# Patient Record
Sex: Female | Born: 1955
Health system: Southern US, Community
[De-identification: ages and names within clinical notes are randomized; demographics above are authoritative.]

## PROBLEM LIST (undated history)

## (undated) DIAGNOSIS — K52831 Collagenous colitis: Secondary | ICD-10-CM

## (undated) DIAGNOSIS — F1021 Alcohol dependence, in remission: Secondary | ICD-10-CM

## (undated) DIAGNOSIS — J42 Unspecified chronic bronchitis: Secondary | ICD-10-CM

## (undated) HISTORY — PX: APPENDECTOMY: SHX54

## (undated) HISTORY — PX: AUGMENTATION MAMMAPLASTY: SUR837

## (undated) HISTORY — PX: BREAST ENHANCEMENT SURGERY: SHX7

---

## 2014-05-04 ENCOUNTER — Encounter: Payer: Self-pay | Admitting: *Deleted

## 2014-05-04 ENCOUNTER — Emergency Department
Admission: EM | Admit: 2014-05-04 | Discharge: 2014-05-04 | Disposition: A | Discharge status: Home / Self Care | Payer: 59 | Source: Home / Self Care | Attending: Family Medicine | Admitting: Family Medicine

## 2014-05-04 DIAGNOSIS — R300 Dysuria: Secondary | ICD-10-CM

## 2014-05-04 DIAGNOSIS — J441 Chronic obstructive pulmonary disease with (acute) exacerbation: Secondary | ICD-10-CM

## 2014-05-04 HISTORY — DX: Collagenous colitis: K52.831

## 2014-05-04 HISTORY — DX: Alcohol dependence, in remission: F10.21

## 2014-05-04 MED ORDER — AZITHROMYCIN 250 MG PO TABS: 250.0000 mg | ORAL_TABLET | Freq: Every day | ORAL | Status: DC

## 2014-05-04 MED ORDER — PREDNISONE 50 MG PO TABS: 50.0000 mg | ORAL_TABLET | Freq: Every day | ORAL | Status: DC

## 2014-05-04 MED ORDER — GUAIFENESIN-CODEINE 100-10 MG/5ML PO SOLN: 5.0000 mL | Freq: Every evening | ORAL | Status: DC | PRN

## 2014-05-04 MED ORDER — IPRATROPIUM-ALBUTEROL 0.5-2.5 (3) MG/3ML IN SOLN
3.0000 mL | Freq: Once | RESPIRATORY_TRACT | Status: AC
  Administered 2014-05-04: 3 mL via RESPIRATORY_TRACT

## 2014-05-04 MED ORDER — ALBUTEROL SULFATE HFA 108 (90 BASE) MCG/ACT IN AERS: 1.0000 | INHALATION_SPRAY | Freq: Four times a day (QID) | RESPIRATORY_TRACT | Status: DC | PRN

## 2014-05-04 NOTE — ED Provider Notes (Signed)
Sara MallardCindy Murray is a 59 y.o. female who presents to Urgent Care today for cough congestion shortness of breath and wheezing. Symptoms present for 5 days. Patient notes subjective fevers and chills fatigue and nausea as well. No chest pain. Patient has tried multiple over-the-counter medications which have not helped much. She is a current smoker with a history of COPD. She did not receive a flu vaccine this season.   Past Medical History  Diagnosis Date  . Recovering alcoholic   . Collagenous colitis    Past Surgical History  Procedure Laterality Date  . Cesarean section    . Appendectomy     History  Substance Use Topics  . Smoking status: Current Every Day Smoker -- 1.00 packs/day for 40 years    Types: Cigarettes  . Smokeless tobacco: Never Used  . Alcohol Use: No     Comment: Recovering alcoholic   ROS as above Medications: No current facility-administered medications for this encounter.   Current Outpatient Prescriptions  Medication Sig Dispense Refill  . albuterol (PROVENTIL HFA;VENTOLIN HFA) 108 (90 BASE) MCG/ACT inhaler Inhale 1-2 puffs into the lungs every 6 (six) hours as needed for wheezing or shortness of breath. 1 Inhaler 0  . azithromycin (ZITHROMAX) 250 MG tablet Take 1 tablet (250 mg total) by mouth daily. Take first 2 tablets together, then 1 every day until finished. 6 tablet 0  . guaiFENesin-codeine 100-10 MG/5ML syrup Take 5 mLs by mouth at bedtime as needed for cough. 120 mL 0  . predniSONE (DELTASONE) 50 MG tablet Take 1 tablet (50 mg total) by mouth daily. 5 tablet 0   Allergies  Allergen Reactions  . Keflex [Cephalexin]      Exam:  BP 131/61 mmHg  Pulse 94  Temp(Src) 97.6 F (36.4 C) (Oral)  Resp 28  Ht 5\' 5"  (1.651 m)  Wt 130 lb (58.968 kg)  BMI 21.63 kg/m2  SpO2 98% Gen: Well NAD HEENT: EOMI,  MMM posterior pharynx with cobblestoning. Normal tympanic membranes bilaterally. Clear nasal discharge present. Lungs: Mild tachypnea with increased work  of breathing. Poor air movement bilaterally with some expiratory wheezing and expiratory delay. Heart: RRR no MRG Abd: NABS, Soft. Nondistended, Nontender Exts: Brisk capillary refill, warm and well perfused.   Patient was given a 2.5/0.5 mg DuoNeb nebulizer treatment, and felt much better. Her tachypnea improved as did her work of breathing and lung exam.  No results found for this or any previous visit (from the past 24 hour(s)). No results found.  Assessment and Plan: 59 y.o. female with COPD exacerbation. Treat with prednisone and albuterol azithromycin and codeine cough suppressant. Note provided. Encouraged patient to quit smoking.  Discussed warning signs or symptoms. Please see discharge instructions. Patient expresses understanding.     Rodolph BongEvan S Yoseline Andersson, MD 05/04/14 [phone]

## 2014-05-04 NOTE — Discharge Instructions (Signed)
Thank you for coming in today. Call or go to the emergency room if you get worse, have trouble breathing, have chest pains, or palpitations.  Do not drive after taking codeine.  Please quit smoking.   Chronic Obstructive Pulmonary Disease Exacerbation Chronic obstructive pulmonary disease (COPD) is a common lung condition in which airflow from the lungs is limited. COPD is a general term that can be used to describe many different lung problems that limit airflow, including chronic bronchitis and emphysema. COPD exacerbations are episodes when breathing symptoms become much worse and require extra treatment. Without treatment, COPD exacerbations can be life threatening, and frequent COPD exacerbations can cause further damage to your lungs. CAUSES   Respiratory infections.   Exposure to smoke.   Exposure to air pollution, chemical fumes, or dust. Sometimes there is no apparent cause or trigger. RISK FACTORS  Smoking cigarettes.  Older age.  Frequent prior COPD exacerbations. SIGNS AND SYMPTOMS   Increased coughing.   Increased thick spit (sputum) production.   Increased wheezing.   Increased shortness of breath.   Rapid breathing.   Chest tightness. DIAGNOSIS  Your medical history, a physical exam, and tests will help your health care provider make a diagnosis. Tests may include:  A chest X-ray.  Basic lab tests.  Sputum testing.  An arterial blood gas test. TREATMENT  Depending on the severity of your COPD exacerbation, you may need to be admitted to a hospital for treatment. Some of the treatments commonly used to treat COPD exacerbations are:   Antibiotic medicines.   Bronchodilators. These are drugs that expand the air passages. They may be given with an inhaler or nebulizer. Spacer devices may be needed to help improve drug delivery.  Corticosteroid medicines.  Supplemental oxygen therapy.  HOME CARE INSTRUCTIONS   Do not smoke. Quitting smoking  is very important to prevent COPD from getting worse and exacerbations from happening as often.  Avoid exposure to all substances that irritate the airway, especially to tobacco smoke.   If you were prescribed an antibiotic medicine, finish it all even if you start to feel better.  Take all medicines as directed by your health care provider.It is important to use correct technique with inhaled medicines.  Drink enough fluids to keep your urine clear or pale yellow (unless you have a medical condition that requires fluid restriction).  Use a cool mist vaporizer. This makes it easier to clear your chest when you cough.   If you have a home nebulizer and oxygen, continue to use them as directed.   Maintain all necessary vaccinations to prevent infections.   Exercise regularly.   Eat a healthy diet.   Keep all follow-up appointments as directed by your health care provider. SEEK IMMEDIATE MEDICAL CARE IF:  You have worsening shortness of breath.   You have trouble talking.   You have severe chest pain.  You have blood in your sputum.  You have a fever.  You have weakness, vomit repeatedly, or faint.   You feel confused.   You continue to get worse. MAKE SURE YOU:   Understand these instructions.  Will watch your condition.  Will get help right away if you are not doing well or get worse. Document Released: 01/07/2007 Document Revised: 07/27/2013 Document Reviewed: 11/14/2012 [facility] Patient Information 2015 [place], [place]. This information is not intended to replace advice given to you by your health care provider. Make sure you discuss any questions you have with your health care provider.

## 2014-05-04 NOTE — ED Notes (Signed)
Kimberlee c/o cough, congestion, HA and SOB x 5 days. 40 year smoker. No flu vac this season.

## 2014-06-05 ENCOUNTER — Emergency Department
Admission: EM | Admit: 2014-06-05 | Discharge: 2014-06-05 | Disposition: A | Discharge status: Home / Self Care | Payer: 59 | Source: Home / Self Care | Attending: Family Medicine | Admitting: Family Medicine

## 2014-06-05 DIAGNOSIS — F418 Other specified anxiety disorders: Secondary | ICD-10-CM

## 2014-06-05 MED ORDER — FLUOXETINE HCL (PMDD) 10 MG PO TABS: 1.0000 | ORAL_TABLET | Freq: Every day | ORAL | Status: DC

## 2014-06-05 NOTE — ED Notes (Signed)
Patient here today with anxiety, patients states that she has a history of anxiety. Episode started this morning at work, she went home and tried to go take a nap to see if she would feel better and did not. States that her job has been really stressful.

## 2014-06-05 NOTE — Discharge Instructions (Signed)
After one week increase fluoxetine (Prozac) 10mg  to two tabs, one time daily    Depression Depression refers to feeling sad, low, down in the dumps, blue, gloomy, or empty. In general, there are two kinds of depression: 1. Normal sadness or normal grief. This kind of depression is one that we all feel from time to time after upsetting life experiences, such as the loss of a job or the ending of a relationship. This kind of depression is considered normal, is short lived, and resolves within a few days to 2 weeks. Depression experienced after the loss of a loved one (bereavement) often lasts longer than 2 weeks but normally gets better with time. 2. Clinical depression. This kind of depression lasts longer than normal sadness or normal grief or interferes with your ability to function at home, at work, and in school. It also interferes with your personal relationships. It affects almost every aspect of your life. Clinical depression is an illness. Symptoms of depression can also be caused by conditions other than those mentioned above, such as:  Physical illness. Some physical illnesses, including underactive thyroid gland (hypothyroidism), severe anemia, specific types of cancer, diabetes, uncontrolled seizures, heart and lung problems, strokes, and chronic pain are commonly associated with symptoms of depression.  Side effects of some prescription medicine. In some people, certain types of medicine can cause symptoms of depression.  Substance abuse. Abuse of alcohol and illicit drugs can cause symptoms of depression. SYMPTOMS Symptoms of normal sadness and normal grief include the following:  Feeling sad or crying for short periods of time.  Not caring about anything (apathy).  Difficulty sleeping or sleeping too much.  No longer able to enjoy the things you used to enjoy.  Desire to be by oneself all the time (social isolation).  Lack of energy or motivation.  Difficulty concentrating  or remembering.  Change in appetite or weight.  Restlessness or agitation. Symptoms of clinical depression include the same symptoms of normal sadness or normal grief and also the following symptoms:  Feeling sad or crying all the time.  Feelings of guilt or worthlessness.  Feelings of hopelessness or helplessness.  Thoughts of suicide or the desire to harm yourself (suicidal ideation).  Loss of touch with reality (psychotic symptoms). Seeing or hearing things that are not real (hallucinations) or having false beliefs about your life or the people around you (delusions and paranoia). DIAGNOSIS  The diagnosis of clinical depression is usually based on how bad the symptoms are and how long they have lasted. Your health care provider will also ask you questions about your medical history and substance use to find out if physical illness, use of prescription medicine, or substance abuse is causing your depression. Your health care provider may also order blood tests. TREATMENT  Often, normal sadness and normal grief do not require treatment. However, sometimes antidepressant medicine is given for bereavement to ease the depressive symptoms until they resolve. The treatment for clinical depression depends on how bad the symptoms are but often includes antidepressant medicine, counseling with a mental health professional, or both. Your health care provider will help to determine what treatment is best for you. Depression caused by physical illness usually goes away with appropriate medical treatment of the illness. If prescription medicine is causing depression, talk with your health care provider about stopping the medicine, decreasing the dose, or changing to another medicine. Depression caused by the abuse of alcohol or illicit drugs goes away when you stop using these substances.  Some adults need professional help in order to stop drinking or using drugs. SEEK IMMEDIATE MEDICAL CARE IF:  You  have thoughts about hurting yourself or others.  You lose touch with reality (have psychotic symptoms).  You are taking medicine for depression and have a serious side effect. FOR MORE INFORMATION  National Alliance on Mental Illness: www.nami.[employer]org  National Institute of Mental Health: http://www.[place]nard.net/www.nimh.nih.gov Document Released: 03/09/2000 Document Revised: 07/27/2013 Document Reviewed: 06/11/2011 [facility]ideExitCare Patient Information 2015 [place] CityExitCare, [place]LL[place]. This information is not intended to replace advice given to you by your health care provider. Make sure you discuss any questions you have with your health care provider.

## 2014-06-05 NOTE — ED Provider Notes (Signed)
CSN: [id number]     Arrival date & time 06/05/14  1727 History   None    Chief Complaint  Patient presents with  . Anxiety      HPI Comments: Patient complains of feeling excessively anxious at work today, where she felt hot and unusually fatigued.  She went home and took a nap without improvement. She reports that she has had increased stress at work for about a year. She complains of increased fatigue, difficulty concentrating, and early morning awakening.  No suicidal thoughts.  She smokes, and admits that she has increased her smoking recently because of stress.  She admits that she is a recovering alcoholic. She has a past history of anxiety and depression, and has tried a variety of SSRI's in the past which were generally helpful.  She states that at present she is limited in her ability to purchase medications.  Patient is a 59 y.o. female presenting with anxiety. The history is provided by the patient.  Anxiety This is a recurrent problem. The current episode started 6 to 12 hours ago. The problem has not changed since onset.Pertinent negatives include no chest pain, no headaches and no shortness of breath. Exacerbated by: stress. Nothing relieves the symptoms. She has tried nothing for the symptoms.    Past Medical History  Diagnosis Date  . Recovering alcoholic   . Collagenous colitis    Past Surgical History  Procedure Laterality Date  . Cesarean section    . Appendectomy     Family History  Problem Relation Age of Onset  . Stroke Father    History  Substance Use Topics  . Smoking status: Current Every Day Smoker -- 1.00 packs/day for 40 years    Types: Cigarettes  . Smokeless tobacco: Never Used  . Alcohol Use: No     Comment: Recovering alcoholic   OB History    No data available     Review of Systems  Constitutional: Positive for fatigue. Negative for activity change, appetite change and unexpected weight change.  Respiratory: Negative for shortness of  breath.   Cardiovascular: Negative for chest pain.  Neurological: Negative for headaches.  Psychiatric/Behavioral: Positive for sleep disturbance and decreased concentration. Negative for suicidal ideas, hallucinations and self-injury. The patient is nervous/anxious.   All other systems reviewed and are negative.   Allergies  Keflex  Home Medications   Prior to Admission medications   Medication Sig Start Date End Date Taking? Authorizing Provider  Fluoxetine HCl, PMDD, 10 MG TABS Take 1 tablet (10 mg total) by mouth daily. 06/05/14   Lattie HawStephen A Beese, MD   BP 158/89 mmHg  Pulse 100  Temp(Src) 98.2 F (36.8 C) (Oral)  Ht 5\' 5"  (1.651 m)  Wt 136 lb (61.689 kg)  BMI 22.63 kg/m2  SpO2 98% Physical Exam Nursing notes and Vital Signs reviewed. Appearance:  Patient appears stated age, and in no acute distress Psychiatric:  Patient is alert and oriented with good eye contact.  Affect is flat and she has depressed mood. Thoughts are organized.  No psychomotor retardation.  Memory intact.  Not suicidal  Eyes:  Pupils are equal, round, and reactive to light and accomodation.  Extraocular movement is intact.  Conjunctivae are not inflamed  Nose:  Normal Pharynx:  Normal Neck:  Supple.  No adenopathy or thyromegaly. Lungs:  Clear to auscultation.  Breath sounds are equal.  Heart:  Regular rate and rhythm without murmurs, rubs, or gallops.  Abdomen:  Nontender without masses or hepatosplenomegaly.  Bowel sounds are present.  No CVA or flank tenderness.  Extremities:  No edema.   Skin:  No rash present.   ED Course  Procedures  none  MDM   1. Depression with anxiety    Begin fluoxetine  once daily. After one week increase fluoxetine (Prozac)  to two tabs, one time daily  Followup with Family Doctor in two weeks.    Lattie Haw, MD 06/08/14 [phone]

## 2015-11-22 ENCOUNTER — Emergency Department
Admission: EM | Admit: 2015-11-22 | Discharge: 2015-11-22 | Disposition: A | Discharge status: Home / Self Care | Payer: 59 | Source: Home / Self Care | Attending: Family Medicine | Admitting: Family Medicine

## 2015-11-22 ENCOUNTER — Encounter: Payer: Self-pay | Admitting: *Deleted

## 2015-11-22 DIAGNOSIS — L0291 Cutaneous abscess, unspecified: Secondary | ICD-10-CM

## 2015-11-22 DIAGNOSIS — L03312 Cellulitis of back [any part except buttock]: Secondary | ICD-10-CM | POA: Diagnosis not present

## 2015-11-22 HISTORY — DX: Unspecified chronic bronchitis: J42

## 2015-11-22 MED ORDER — HYDROCODONE-ACETAMINOPHEN 5-325 MG PO TABS: 1.0000 | ORAL_TABLET | Freq: Four times a day (QID) | ORAL | 0 refills | Status: DC | PRN

## 2015-11-22 MED ORDER — CLINDAMYCIN HCL 300 MG PO CAPS: 300.0000 mg | ORAL_CAPSULE | Freq: Three times a day (TID) | ORAL | 0 refills | Status: DC

## 2015-11-22 NOTE — Discharge Instructions (Signed)
Leave bandage in place until follow-up visit.  Keep bandage clean and dry.

## 2015-11-22 NOTE — ED Provider Notes (Signed)
[facility]    CSN: [id number] Arrival date & time: 11/22/15  1354  First Provider Contact:  First MD Initiated Contact with Patient 11/22/15 1436        History   Chief Complaint Chief Complaint  Patient presents with  . Abscess    HPI Sara Murray is a 60 y.o. female.   Patient complains of four day history of painful draining lesion on her lower back.  She recalls no injury or insect bite.  She has pain when sitting and supine in bed.  No fevers, chills, and sweats    The history is provided by the patient.    Past Medical History:  Diagnosis Date  . Chronic bronchitis (HCC)   . Collagenous colitis   . Recovering alcoholic (HCC)     There are no active problems to display for this patient.   Past Surgical History:  Procedure Laterality Date  . APPENDECTOMY    . CESAREAN SECTION      OB History    No data available       Home Medications    Prior to Admission medications   Medication Sig Start Date End Date Taking? Authorizing Provider  acetaminophen (TYLENOL) 325 MG tablet Take 650 mg by mouth every 6 (six) hours as needed.   Yes Historical Provider, MD  aspirin EC 81 MG tablet Take 81 mg by mouth daily.   Yes Historical Provider, MD  MULTIPLE VITAMIN PO Take by mouth.   Yes Historical Provider, MD  clindamycin (CLEOCIN) 300 MG capsule Take 1 capsule (300 mg total) by mouth 3 (three) times daily. 11/22/15   Lattie Haw, MD  HYDROcodone-acetaminophen (NORCO/VICODIN) 5-325 MG tablet Take 1 tablet by mouth every 6 (six) hours as needed. 11/22/15   Lattie Haw, MD    Family History Family History  Problem Relation Age of Onset  . Stroke Father     Social History Social History  Substance Use Topics  . Smoking status: Current Every Day Smoker    Packs/day: 0.50    Years: 40.00    Types: Cigarettes  . Smokeless tobacco: Never Used  . Alcohol use No     Comment: Recovering alcoholic     Allergies   Keflex  [cephalexin]   Review of Systems Review of Systems  Constitutional: Negative for activity change, appetite change, chills, diaphoresis, fatigue and fever.  Skin: Positive for color change and wound.  All other systems reviewed and are negative.    Physical Exam Triage Vital Signs ED Triage Vitals  Enc Vitals Group     BP 11/22/15 1411 121/78     Pulse Rate 11/22/15 1411 77     Resp 11/22/15 1411 16     Temp 11/22/15 1411 98 F (36.7 C)     Temp Source 11/22/15 1411 Oral     SpO2 11/22/15 1411 100 %     Weight 11/22/15 1412 158 lb (71.7 kg)     Height 11/22/15 1412 5\' 5"  (1.651 m)     Head Circumference --      Peak Flow --      Pain Score 11/22/15 1414 4     Pain Loc --      Pain Edu? --      Excl. in GC? --    No data found.   Updated Vital Signs BP 121/78 (BP Location: Left Arm)   Pulse 77   Temp 98 F (36.7 C) (Oral)   Resp 16  Ht 5\' 5"  (1.651 m)   Wt 158 lb (71.7 kg)   SpO2 100%   BMI 26.29 kg/m   Visual Acuity Right Eye Distance:   Left Eye Distance:   Bilateral Distance:    Right Eye Near:   Left Eye Near:    Bilateral Near:     Physical Exam  Constitutional: She appears well-developed and well-nourished. No distress.  HENT:  Head: Normocephalic.  Eyes: Conjunctivae are normal. Pupils are equal, round, and reactive to light.  Cardiovascular: Normal heart sounds.   Pulmonary/Chest: Breath sounds normal.  Musculoskeletal:       Back:  Left lower back has an erythematous indurated tender area about 2cm by 3cm, with small amount purulent fluid present.  There is surrounding erythema and tenderness.  Lesion is not fluctuant.  Neurological: She is alert.  Skin: Skin is warm and dry.     UC Treatments / Results  Labs (all labs ordered are listed, but only abnormal results are displayed) Labs Reviewed  WOUND CULTURE    EKG  EKG Interpretation None       Radiology No results found.  Procedures Procedures (including critical care  time)  Medications Ordered in UC Medications - No data to display   Initial Impression / Assessment and Plan / UC Course  I have reviewed the triage vital signs and the nursing notes.  Pertinent labs & imaging results that were available during my care of the patient were reviewed by me and considered in my medical decision making (see chart for details).  Clinical Course   Lesion does not appear to be an abscess or infected sebaceous cyst. Wound culture pending.  Begin Clindamycin 300mg  TID. Applied Mepelex border dressing. Leave bandage in place until follow-up visit in two days.  Keep bandage clean and dry. Rx for Lortab for pain control.    Final Clinical Impressions(s) / UC Diagnoses   Final diagnoses:  Abscess  Cellulitis of lower back    New Prescriptions Discharge Medication List as of 11/22/2015  2:56 PM    START taking these medications   Details  clindamycin (CLEOCIN) 300 MG capsule Take 1 capsule (300 mg total) by mouth 3 (three) times daily., Starting Tue 11/22/2015, Print    HYDROcodone-acetaminophen (NORCO/VICODIN) 5-325 MG tablet Take 1 tablet by mouth every 6 (six) hours as needed., Starting Tue 11/22/2015, Print         Lattie HawStephen A Curtis Cain, MD 11/22/15 [phone]

## 2015-11-22 NOTE — ED Triage Notes (Signed)
Pt c/o abscess on her lower back x 2 days. It is painful and has some brown drainage.

## 2015-11-25 ENCOUNTER — Telehealth: Payer: Self-pay | Admitting: *Deleted

## 2015-11-25 LAB — WOUND CULTURE: GRAM STAIN: NONE SEEN

## 2015-11-25 NOTE — Telephone Encounter (Signed)
Callback: Pt reports she is much improved. WCX results given and encouraged to complete antibiotic.

## 2016-03-12 ENCOUNTER — Emergency Department
Admission: EM | Admit: 2016-03-12 | Discharge: 2016-03-12 | Disposition: A | Discharge status: Home / Self Care | Payer: 59 | Source: Home / Self Care | Attending: Family Medicine | Admitting: Family Medicine

## 2016-03-12 ENCOUNTER — Emergency Department (INDEPENDENT_AMBULATORY_CARE_PROVIDER_SITE_OTHER): Payer: 59

## 2016-03-12 ENCOUNTER — Encounter: Payer: Self-pay | Admitting: *Deleted

## 2016-03-12 DIAGNOSIS — F17200 Nicotine dependence, unspecified, uncomplicated: Secondary | ICD-10-CM | POA: Diagnosis not present

## 2016-03-12 DIAGNOSIS — B9789 Other viral agents as the cause of diseases classified elsewhere: Secondary | ICD-10-CM

## 2016-03-12 DIAGNOSIS — S20212A Contusion of left front wall of thorax, initial encounter: Secondary | ICD-10-CM

## 2016-03-12 DIAGNOSIS — J069 Acute upper respiratory infection, unspecified: Secondary | ICD-10-CM

## 2016-03-12 DIAGNOSIS — R05 Cough: Secondary | ICD-10-CM

## 2016-03-12 MED ORDER — PREDNISONE 20 MG PO TABS: ORAL_TABLET | ORAL | 0 refills | Status: DC

## 2016-03-12 MED ORDER — AZITHROMYCIN 250 MG PO TABS: ORAL_TABLET | ORAL | 0 refills | Status: DC

## 2016-03-12 NOTE — Discharge Instructions (Signed)
Take plain guaifenesin (1200mg extended release tabs such as Mucinex) twice daily, with plenty of water, for cough and congestion.  May add Pseudoephedrine (30mg, one or two every 4 to 6 hours) for sinus congestion.  Get adequate rest.   °May use Afrin nasal spray (or generic oxymetazoline) twice daily for about 5 days and then discontinue.  Also recommend using saline nasal spray several times daily and saline nasal irrigation (AYR is a common brand).  Use Flonase nasal spray each morning after using Afrin nasal spray and saline nasal irrigation. °Try warm salt water gargles for sore throat.  °Stop all antihistamines for now, and other non-prescription cough/cold preparations. °May take Delsym Cough Suppressant at bedtime for nighttime cough.  °Begin Azithromycin if not improving about one week or if persistent fever develops    °Follow-up with family doctor if not improving about10 days.  °

## 2016-03-12 NOTE — ED Provider Notes (Signed)
[facility]    CSN: [id number] Arrival date & time: 03/12/16  1342     History   Chief Complaint Chief Complaint  Patient presents with  . Back Pain    HPI Sara Murray is a 60 y.o. female.   Patient presents with two complaints: 1)  She fell on concrete about one week ago, landing on her left side.  Two days later she had mid-upper back pain.  2)   She complains of five day history of typical cold-like symptoms developing over several days, including sinus congestion, fatigue, night sweats, and cough.  Her cough is worse at night, and she has developed occasional wheezing and shortness of breath.   The history is provided by the patient.    Past Medical History:  Diagnosis Date  . Chronic bronchitis (HCC)   . Collagenous colitis   . Recovering alcoholic (HCC)     There are no active problems to display for this patient.   Past Surgical History:  Procedure Laterality Date  . APPENDECTOMY    . CESAREAN SECTION      OB History    No data available       Home Medications    Prior to Admission medications   Medication Sig Start Date End Date Taking? Authorizing Provider  acetaminophen (TYLENOL) 325 MG tablet Take 650 mg by mouth every 6 (six) hours as needed.    Historical Provider, MD  aspirin EC 81 MG tablet Take 81 mg by mouth daily.    Historical Provider, MD  doxycycline (VIBRAMYCIN) 100 MG capsule Take 1 capsule (100 mg total) by mouth 2 (two) times daily. One po bid x 7 days 03/23/16   Junius Finner, PA-C  HYDROcodone-acetaminophen (NORCO/VICODIN) 5-325 MG tablet Take 1-2 tablets by mouth every 6 (six) hours as needed for moderate pain or severe pain. 03/23/16   Junius Finner, PA-C  MULTIPLE VITAMIN PO Take by mouth.    Historical Provider, MD    Family History Family History  Problem Relation Age of Onset  . Stroke Father     Social History Social History  Substance Use Topics  . Smoking status: Current Every Day Smoker   Packs/day: 0.50    Years: 40.00    Types: Cigarettes  . Smokeless tobacco: Never Used  . Alcohol use No     Comment: Recovering alcoholic     Allergies   Keflex [cephalexin]   Review of Systems Review of Systems No sore throat + cough No pleuritic pain + wheezing + nasal congestion + post-nasal drainage No sinus pain/pressure No itchy/red eyes No earache No hemoptysis + SOB No fever, + chills/sweats No nausea No vomiting No abdominal pain No diarrhea No urinary symptoms No skin rash + fatigue No myalgias No headache Used OTC meds without relief   Physical Exam Triage Vital Signs ED Triage Vitals  Enc Vitals Group     BP 03/12/16 1406 126/85     Pulse Rate 03/12/16 1406 83     Resp 03/12/16 1406 20     Temp 03/12/16 1406 97.9 F (36.6 C)     Temp Source 03/12/16 1406 Oral     SpO2 03/12/16 1406 99 %     Weight 03/12/16 1407 156 lb (70.8 kg)     Height --      Head Circumference --      Peak Flow --      Pain Score 03/12/16 1410 5     Pain Loc --  Pain Edu? --      Excl. in GC? --    No data found.   Updated Vital Signs BP 126/85 (BP Location: Left Arm)   Pulse 83   Temp 97.9 F (36.6 C) (Oral)   Resp 20   Wt 156 lb (70.8 kg)   SpO2 99%   BMI 25.96 kg/m   Visual Acuity Right Eye Distance:   Left Eye Distance:   Bilateral Distance:    Right Eye Near:   Left Eye Near:    Bilateral Near:     Physical Exam Nursing notes and Vital Signs reviewed. Appearance:  Patient appears stated age, and in no acute distress Eyes:  Pupils are equal, round, and reactive to light and accomodation.  Extraocular movement is intact.  Conjunctivae are not inflamed  Ears:  Canals normal.  Tympanic membranes normal.  Nose:  Mildly congested turbinates.  No sinus tenderness.   Pharynx:  Normal Neck:  Supple.  Tender enlarged posterior/lateral nodes are palpated bilaterally  Lungs:  Clear to auscultation.  Breath sounds are equal.  Moving air  well. Chest:  No tenderness to palpation  Heart:  Regular rate and rhythm without murmurs, rubs, or gallops.  Abdomen:  Nontender without masses or hepatosplenomegaly.  Bowel sounds are present.  No CVA or flank tenderness.  Extremities:  No edema.  Skin:  No rash present.    UC Treatments / Results  Labs (all labs ordered are listed, but only abnormal results are displayed) Labs Reviewed - No data to display  EKG  EKG Interpretation None       Radiology No results found.  Procedures Procedures (including critical care time)  Medications Ordered in UC Medications - No data to display   Initial Impression / Assessment and Plan / UC Course  I have reviewed the triage vital signs and the nursing notes.  Pertinent labs & imaging results that were available during my care of the patient were reviewed by me and considered in my medical decision making (see chart for details).  Clinical Course   There is no evidence of bacterial infection today.   Begin prednisone burst/taper. Take plain guaifenesin (1200mg  extended release tabs such as Mucinex) twice daily, with plenty of water, for cough and congestion.  May add Pseudoephedrine (30mg , one or two every 4 to 6 hours) for sinus congestion.  Get adequate rest.   May use Afrin nasal spray (or generic oxymetazoline) twice daily for about 5 days and then discontinue.  Also recommend using saline nasal spray several times daily and saline nasal irrigation (AYR is a common brand).  Use Flonase nasal spray each morning after using Afrin nasal spray and saline nasal irrigation. Try warm salt water gargles for sore throat.  Stop all antihistamines for now, and other non-prescription cough/cold preparations. May take Delsym Cough Suppressant at bedtime for nighttime cough.  Begin Azithromycin if not improving about one week or if persistent fever develops (Given a prescription to hold, with an expiration date)  Follow-up with family doctor  if not improving about10 days.      Final Clinical Impressions(s) / UC Diagnoses   Final diagnoses:  Viral URI with cough  Contusion, chest wall, left, initial encounter    New Prescriptions Discharge Medication List as of 03/12/2016  3:35 PM    START taking these medications   Details  azithromycin (ZITHROMAX Z-PAK) 250 MG tablet Take 2 tabs today; then begin one tab once daily for 4 more days. (Rx void  after 03/20/16), Print    predniSONE (DELTASONE) 20 MG tablet Take one tab by mouth twice daily for 5 days, then one daily. Take with food., Print         Lattie HawStephen A Derryl Uher, MD 03/25/16 [phone]

## 2016-03-12 NOTE — ED Triage Notes (Signed)
Patient c/omid/upper back pain x 5 days. She reports falling onto her left side 1 weeks ago, pain started 2 days later. Taken ASA, tylenol and IBF.

## 2016-03-23 ENCOUNTER — Encounter: Payer: Self-pay | Admitting: *Deleted

## 2016-03-23 ENCOUNTER — Emergency Department
Admission: EM | Admit: 2016-03-23 | Discharge: 2016-03-23 | Disposition: A | Discharge status: Home / Self Care | Payer: 59 | Source: Home / Self Care | Attending: Family Medicine | Admitting: Family Medicine

## 2016-03-23 DIAGNOSIS — L02411 Cutaneous abscess of right axilla: Secondary | ICD-10-CM | POA: Diagnosis not present

## 2016-03-23 MED ORDER — HYDROCODONE-ACETAMINOPHEN 5-325 MG PO TABS: 1.0000 | ORAL_TABLET | Freq: Four times a day (QID) | ORAL | 0 refills | Status: DC | PRN

## 2016-03-23 MED ORDER — DOXYCYCLINE HYCLATE 100 MG PO CAPS: 100.0000 mg | ORAL_CAPSULE | Freq: Two times a day (BID) | ORAL | 0 refills | Status: DC

## 2016-03-23 NOTE — Discharge Instructions (Signed)
°  Norco/Vicodin (hydrocodone-acetaminophen) is a narcotic pain medication, do not combine these medications with others containing tylenol. While taking, do not drink alcohol, drive, or perform any other activities that requires focus while taking these medications.  ° °

## 2016-03-23 NOTE — ED Provider Notes (Signed)
CSN: [id number]     Arrival date & time 03/23/16  1326 History   First MD Initiated Contact with Patient 03/23/16 1450     Chief Complaint  Patient presents with  . Cellulitis  . Abscess   (Consider location/radiation/quality/duration/timing/severity/associated sxs/prior Treatment) HPI  Sara Murray is a 60 y.o. female presenting to UC with c/o gradually worsening redness, swelling, and pain under Right axilla that started on 03/12/16.  Minimal drainage. Pain is 5/10, worse with palpation.  Hx of similar abscess in groin in the past but never in the axilla.  She has tried warm compresses and squeezing the area trying to get it to pop last night but no drainage last night. Denies fever, chills, n/v/d.  She notes her daughter has had multiple abscesses in the past and says hers looks similar to the abscesses her daughter gets.    Past Medical History:  Diagnosis Date  . Chronic bronchitis (HCC)   . Collagenous colitis   . Recovering alcoholic [facility](HCC)    Past Surgical History:  Procedure Laterality Date  . APPENDECTOMY    . CESAREAN SECTION     Family History  Problem Relation Age of Onset  . Stroke Father    Social History  Substance Use Topics  . Smoking status: Current Every Day Smoker    Packs/day: 0.50    Years: 40.00    Types: Cigarettes  . Smokeless tobacco: Never Used  . Alcohol use No     Comment: Recovering alcoholic   OB History    No data available     Review of Systems  Constitutional: Negative for chills and fever.  Gastrointestinal: Negative for diarrhea, nausea and vomiting.  Skin: Positive for color change. Negative for wound.       Right axilla abscess     Allergies  Keflex [cephalexin]  Home Medications   Prior to Admission medications   Medication Sig Start Date End Date Taking? Authorizing Provider  acetaminophen (TYLENOL) 325 MG tablet Take 650 mg by mouth every 6 (six) hours as needed.    Historical Provider, MD  aspirin EC 81 MG tablet Take  81 mg by mouth daily.    Historical Provider, MD  doxycycline (VIBRAMYCIN) 100 MG capsule Take 1 capsule (100 mg total) by mouth 2 (two) times daily. One po bid x 7 days 03/23/16   Junius FinnerErin O'Malley, PA-C  HYDROcodone-acetaminophen (NORCO/VICODIN) 5-325 MG tablet Take 1-2 tablets by mouth every 6 (six) hours as needed for moderate pain or severe pain. 03/23/16   Junius FinnerErin O'Malley, PA-C  MULTIPLE VITAMIN PO Take by mouth.    Historical Provider, MD   Meds Ordered and Administered this Visit  Medications - No data to display  BP 154/76 (BP Location: Left Arm)   Pulse 80   Temp 98.3 F (36.8 C) (Oral)   Resp 16   SpO2 100%  No data found.   Physical Exam  Constitutional: She is oriented to person, place, and time. She appears well-developed and well-nourished. No distress.  HENT:  Head: Normocephalic and atraumatic.  Eyes: EOM are normal.  Neck: Normal range of motion.  Cardiovascular: Normal rate.   Pulmonary/Chest: Effort normal.  Musculoskeletal: Normal range of motion.  Neurological: She is alert and oriented to person, place, and time.  Skin: Skin is warm and dry. She is not diaphoretic. There is erythema.  Right axilla: 5x6cm area of erythema with mild edema, overlying dried peeling skin. Centralized fluctuance with small pea-sized abscess just inferior to larger abscess.  No active bleeding or drainage.   Psychiatric: She has a normal mood and affect. Her behavior is normal.  Nursing note and vitals reviewed.   Urgent Care Course   Clinical Course     .Marland Kitchen.Incision and Drainage Date/Time: 03/23/2016 3:58 PM Performed by: Junius Finner'MALLEY, Dezmon Conover Authorized by: Donna ChristenBEESE, STEPHEN A   Consent:    Consent obtained:  Verbal   Consent given by:  Patient   Risks discussed:  Bleeding, incomplete drainage, infection and pain   Alternatives discussed:  Alternative treatment Location:    Type:  Abscess   Size:  5-6cm   Location:  Upper extremity   Upper extremity location: Right  axilla. Pre-procedure details:    Skin preparation:  Betadine Anesthesia (see MAR for exact dosages):    Anesthesia method:  Local infiltration   Local anesthetic:  Lidocaine 1% WITH epi Procedure type:    Complexity:  Simple Procedure details:    Needle aspiration: no     Incision types:  Single straight   Incision depth:  Subcutaneous   Scalpel blade:  11   Wound management:  Probed and deloculated   Drainage:  Purulent and bloody   Drainage amount:  Moderate   Wound treatment:  Wound left open   Packing materials:  None Post-procedure details:    Patient tolerance of procedure:  Tolerated well, no immediate complications .Marland Kitchen.Incision and Drainage Date/Time: 03/23/2016 3:59 PM Performed by: Junius Finner'MALLEY, Eisha Chatterjee Authorized by: Donna ChristenBEESE, STEPHEN A   Consent:    Consent obtained:  Verbal   Consent given by:  Patient   Risks discussed:  Incomplete drainage, bleeding, infection and pain   Alternatives discussed:  Alternative treatment Location:    Type:  Abscess   Size:  0.5   Location:  Upper extremity   Upper extremity location: Right axilla. Pre-procedure details:    Skin preparation:  Betadine Anesthesia (see MAR for exact dosages):    Anesthesia method:  Local infiltration   Local anesthetic:  Lidocaine 1% WITH epi Procedure type:    Complexity:  Simple Procedure details:    Needle aspiration: no     Incision types:  Single straight   Incision depth:  Subcutaneous   Scalpel blade:  11   Wound management:  Probed and deloculated   Drainage:  Bloody and purulent   Drainage amount:  Moderate   Wound treatment:  Wound left open   Packing materials:  None Post-procedure details:    Patient tolerance of procedure:  Tolerated well, no immediate complications   (including critical care time)  Labs Review Labs Reviewed  WOUND CULTURE    Imaging Review No results found.    MDM   1. Abscess of axilla, right    Large abscess of Right axilla with smaller abscess just  inferior.  I&D successfully performed. Would culture sent.  Rx: Doxycycline and norco.  Home care instructions provided. Encouraged f/u in 3-4 days if not improving. Sooner if worsening.     Junius Finnerrin O'Malley, PA-C 03/23/16 1601

## 2016-03-23 NOTE — ED Triage Notes (Signed)
Patient c/o soreness to right axilla since 03/12/2016. He has since developed to in an abscess with large area of surrounding redness, minimal drainage. Denies aches and fever.

## 2016-03-26 ENCOUNTER — Telehealth: Payer: Self-pay | Admitting: *Deleted

## 2016-03-26 LAB — WOUND CULTURE
Gram Stain: NONE SEEN
Gram Stain: NONE SEEN

## 2016-03-26 NOTE — Telephone Encounter (Signed)
Callback: patient reports the site of infection is much improved. WCX results given and discussed. Encouraged to complete antibiotic course.

## 2016-05-29 ENCOUNTER — Ambulatory Visit (INDEPENDENT_AMBULATORY_CARE_PROVIDER_SITE_OTHER): Payer: 59 | Admitting: Family Medicine

## 2016-05-29 ENCOUNTER — Encounter: Payer: Self-pay | Admitting: Family Medicine

## 2016-05-29 DIAGNOSIS — M25551 Pain in right hip: Secondary | ICD-10-CM | POA: Diagnosis not present

## 2016-05-29 MED ORDER — PREDNISONE 5 MG (48) PO TBPK: ORAL_TABLET | ORAL | 0 refills | Status: DC

## 2016-05-29 NOTE — Patient Instructions (Addendum)
Thank you for coming in today. Take prednisone Attend PT.  Recheck in 2-4 weeks. Return sooner if needed.  Do the stretches we discussed.    Trochanteric Bursitis Trochanteric bursitis is a condition that causes hip pain. Trochanteric bursitis happens when fluid-filled sacs (bursae) in the hip get irritated. Normally these sacs absorb shock and help strong bands of tissue (tendons) in your hip glide smoothly over each other and over your hip bones. What are the causes? This condition results from increased friction between the hip bones and the tendons that go over them. This condition can happen if you:  Have weak hips.  Use your hip muscles too much (overuse).  Get hit in the hip. What increases the risk? This condition is more likely to develop in:  Women.  Adults who are middle-aged or older.  People with arthritis or a spinal condition.  People with weak buttocks muscles (gluteal muscles).  People who have one leg that is shorter than the other.  People who participate in certain kinds of athletic activities, such as:  Running sports, especially long-distance running.  Contact sports, like football or martial arts.  Sports in which falls may occur, like skiing. What are the signs or symptoms? The main symptom of this condition is pain and tenderness over the point of your hip. The pain may be:  Sharp and intense.  Dull and achy.  Felt on the outside of your thigh. It may increase when you:  Lie on your side.  Walk or run.  Go up on stairs.  Sit.  Stand up after sitting.  Stand for long periods of time. How is this diagnosed? This condition may be diagnosed based on:  Your symptoms.  Your medical history.  A physical exam.  Imaging tests, such as:  X-rays to check your bones.  An MRI or ultrasound to check your tendons and muscles. During your physical exam, your health care provider will check the movement and strength of your hip. He or  she may press on the point of your hip to check for pain. How is this treated? This condition may be treated by:  Resting.  Reducing your activity.  Avoiding activities that cause pain.  Using crutches, a cane, or a walker to decrease the strain on your hip.  Taking medicine to help with swelling.  Having medicine injected into the bursae to help with swelling.  Using ice, heat, and massage therapy for pain relief.  Physical therapy exercises for strength and flexibility.  Surgery (rare). Follow these instructions at home: Activity   Rest.  Avoid activities that cause pain.  Return to your normal activities as told by your health care provider. Ask your health care provider what activities are safe for you. Managing pain, stiffness, and swelling   Take over-the-counter and prescription medicines only as told by your health care provider.  If directed, apply heat to the injured area as told by your health care provider.  Place a towel between your skin and the heat source.  Leave the heat on for 20-30 minutes.  Remove the heat if your skin turns bright red. This is especially important if you are unable to feel pain, heat, or cold. You may have a greater risk of getting burned.  If directed, apply ice to the injured area:  Put ice in a plastic bag.  Place a towel between your skin and the bag.  Leave the ice on for 20 minutes, 2-3 times a day. General instructions  If the affected leg is one that you use for driving, ask your health care provider when it is safe to drive.  Use crutches, a cane, or a walker as told by your health care provider.  If one of your legs is shorter than the other, get fitted for a shoe insert.  Lose weight if you are overweight. How is this prevented?  Wear supportive footwear that is appropriate for your sport.  If you have hip pain, start any new exercise or sport slowly.  Maintain physical fitness,  including:  Strength.  Flexibility. Contact a health care provider if:  Your pain does not improve with 2-4 weeks. Get help right away if:  You develop severe pain.  You have a fever.  You develop increased redness over your hip.  You have a change in your bowel function or bladder function.  You cannot control the muscles in your feet. This information is not intended to replace advice given to you by your health care provider. Make sure you discuss any questions you have with your health care provider. Document Released: 04/19/2004 Document Revised: 11/16/2015 Document Reviewed: 02/25/2015 Elsevier Interactive Patient Education  2017 [employer].

## 2016-05-29 NOTE — Progress Notes (Signed)
   Subjective:    I'm seeing this patient as a consultation for:  Junius FinnerErin O'Malley PA-C  CC: Back pain and buttock pain.  HPI: Patient notes right buttock pain present for several months worsening over the last 3 weeks. She notes pain at the buttocks and right lateral hip radiating to the posterior thigh. Occasionally she has pain radiating to the bilateral lateral calves. She denies any weakness or numbness bowel bladder dysfunction. Pain is worse with lying on the right side stay from a seated position and prolonged standing. She's tried some over-the-counter pain medications which have helped a bit. She feels well otherwise. She denies any injury.  Past medical history, Surgical history, Family history not pertinant except as noted below, Social history, Allergies, and medications have been entered into the medical record, reviewed, and no changes needed.   Review of Systems: No headache, visual changes, nausea, vomiting, diarrhea, constipation, dizziness, abdominal pain, skin rash, fevers, chills, night sweats, weight loss, swollen lymph nodes, body aches, joint swelling, muscle aches, chest pain, shortness of breath, mood changes, visual or auditory hallucinations.   Objective:    Vitals:   05/29/16 1358  BP: (!) 150/102  Pulse: 80   General: Well Developed, well nourished, and in no acute distress.  Neuro/Psych: Alert and oriented x3, extra-ocular muscles intact, able to move all 4 extremities, sensation grossly intact. Skin: Warm and dry, no rashes noted.  Respiratory: Not using accessory muscles, speaking in full sentences, trachea midline.  Cardiovascular: Pulses palpable, no extremity edema. Abdomen: Does not appear distended. MSK:  L spine: Nontender to midline. Normal back motion. Right hip: Normal-appearing normal motion. Tender palpation at the posterior aspect of the greater trochanter and at the initial tuberosity. Strength is diminished and painful 4+/5 to resisted hip  abduction and to knee flexion. Capillary refill pulses and sensation are intact distally. Patient walks with an antalgic gait with a bit of a Trendelenburg gait as well.   Limited MSK US right lateral and posterior hip: Hypoechoic fluid collection near the insertion at the initial tuberosity of the hamstring. The tendon insertion appears to be intact. No obvious abnormalities at the lateral hip near the insertion of the piriformis gluteus medius and minimus tendons   No results found for this or any previous visit (from the past 24 hour(s)). No results found.  Impression and Recommendations:    Assessment and Plan: 61 y.o. female with  Right lateral posterior hip pain. This is greater trochanter pain syndrome versus initial tuberosity bursitis insertional tendinopathy. Plan for conservative management with referral to physical therapy as well as trial of oral prednisone. Not better will consider injection versus MRI. Work note provided [employer]ck in 2-4 weeks.   Discussed warning signs or symptoms. Please see discharge instructions. Patient expresses understanding.

## 2016-06-01 ENCOUNTER — Ambulatory Visit: Payer: 59 | Admitting: Rehabilitative and Restorative Service Providers"

## 2016-06-13 ENCOUNTER — Ambulatory Visit (INDEPENDENT_AMBULATORY_CARE_PROVIDER_SITE_OTHER): Payer: 59

## 2016-06-13 ENCOUNTER — Encounter: Payer: Self-pay | Admitting: Family Medicine

## 2016-06-13 ENCOUNTER — Ambulatory Visit (INDEPENDENT_AMBULATORY_CARE_PROVIDER_SITE_OTHER): Payer: 59 | Admitting: Family Medicine

## 2016-06-13 VITALS — BP 155/121 | HR 78

## 2016-06-13 DIAGNOSIS — S32599A Other specified fracture of unspecified pubis, initial encounter for closed fracture: Secondary | ICD-10-CM | POA: Insufficient documentation

## 2016-06-13 DIAGNOSIS — X58XXXA Exposure to other specified factors, initial encounter: Secondary | ICD-10-CM

## 2016-06-13 DIAGNOSIS — S32592A Other specified fracture of left pubis, initial encounter for closed fracture: Secondary | ICD-10-CM | POA: Diagnosis not present

## 2016-06-13 DIAGNOSIS — R1032 Left lower quadrant pain: Secondary | ICD-10-CM | POA: Diagnosis not present

## 2016-06-13 DIAGNOSIS — M84350A Stress fracture, pelvis, initial encounter for fracture: Secondary | ICD-10-CM | POA: Diagnosis not present

## 2016-06-13 MED ORDER — TRAMADOL HCL 50 MG PO TABS: 50.0000 mg | ORAL_TABLET | Freq: Three times a day (TID) | ORAL | 0 refills | Status: DC | PRN

## 2016-06-13 NOTE — Progress Notes (Signed)
Sara Murray is a 61 y.o. female who presents to [facility] today for left groin pain. Patient was seen a few weeks ago for right lateral hip pain thought to be trochanteric bursitis her initial tuberosity bursitis. She was treated with oral prednisone and felt much better. She was doing pretty well until 4 days ago. She denies any specific injury but notes that she had a long car ride. She subsequently has developed significant severe left groin pain worse with hip motion and with ambulation. She is limping significantly. She's tried some over-the-counter medications which have helped only a little. She has the pain is in the groin and radiates to the anterior thigh. She denies any pain radiating beyond the level of her knee.   Past Medical History:  Diagnosis Date  . Chronic bronchitis (HCC)   . Collagenous colitis   . Recovering alcoholic [facility]    Past Surgical History:  Procedure Laterality Date  . APPENDECTOMY    . CESAREAN SECTION     Social History  Substance Use Topics  . Smoking status: Current Every Day Smoker    Packs/day: 0.50    Years: 40.00    Types: Cigarettes  . Smokeless tobacco: Never Used  . Alcohol use No     Comment: Recovering alcoholic     ROS:  As above   Medications: Current Outpatient Prescriptions  Medication Sig Dispense Refill  . acetaminophen (TYLENOL) 325 MG tablet Take 650 mg by mouth every 6 (six) hours as needed.    Marland Kitchen aspirin EC 81 MG tablet Take 81 mg by mouth daily.    . MULTIPLE VITAMIN PO Take by mouth.    . predniSONE (STERAPRED UNI-PAK 48 TAB) 5 MG (48) TBPK tablet 12 day dosepack po 48 tablet 0  . traMADol (ULTRAM) 50 MG tablet Take 1 tablet (50 mg total) by mouth every 8 (eight) hours as needed. 15 tablet 0   No current facility-administered medications for this visit.    Allergies  Allergen Reactions  . Keflex [Cephalexin]      Exam:  BP (!) 155/121   Pulse 78   General: Well  Developed, well nourished, and in no acute distress.  Neuro/Psych: Alert and oriented x3, extra-ocular muscles intact, able to move all 4 extremities, sensation grossly intact. Skin: Warm and dry, no rashes noted.  Respiratory: Not using accessory muscles, speaking in full sentences, trachea midline.  Cardiovascular: Pulses palpable, no extremity edema. Abdomen: Does not appear distended. MSK: Left hip: Nontender. Significant pain with internal rotation. Limited weight bearing due to pain. Antalgic gait.     No results found for this or any previous visit (from the past 48 hour(s)). Dg Hip Unilat With Pelvis 2-3 Views Left  Result Date: 06/13/2016 CLINICAL DATA:  Left groin pain, no known injury, initial encounter EXAM: DG HIP (WITH OR WITHOUT PELVIS) 2-3V LEFT COMPARISON:  None. FINDINGS: Pelvic ring demonstrates evidence of a fracture through the superior left pubic ramus extending into the pubic symphysis. There is likely undisplaced inferior pubic ramus fracture centrally as well. Proximal femur is within normal limits. Mild degenerative changes of the hip joints are seen. IMPRESSION: Pubic ramus fractures on the left likely of an acute to subacute nature. Clinical correlation is recommended. Electronically Signed   By: Alcide Clever M.D.   On: 06/13/2016 16:52      Assessment and Plan: 61 y.o. female with pubic rami fracture. Unclear when this happened. Plan for nonweightbearing to Limited  weight bearing. Recheck in 1 week. This patient seemed to have this fracture without a lot of injury I'm suspicious for osteoporosis. Plan for DEXA scan.    Orders Placed This Encounter  Procedures  . DG HIP UNILAT WITH PELVIS 2-3 VIEWS LEFT    Standing Status:   Future    Number of Occurrences:   1    Standing Expiration Date:   08/13/2017    Order Specific Question:   Reason for Exam (SYMPTOM  OR DIAGNOSIS REQUIRED)    Answer:   eval pain left groin    Order Specific Question:   Preferred  imaging [facility]?    [facility]  . DG Bone Density    Standing Status:   Future    Standing Expiration Date:   08/14/2017    Order Specific Question:   Reason for exam:    Answer:   eval bone densisty s/p pubic rami fracture    Order Specific Question:   Preferred imaging location?    [facility]     Discussed warning signs or symptoms. Please see discharge instructions. Patient expresses understanding.

## 2016-06-13 NOTE — Patient Instructions (Signed)
Thank you for coming in today. Keep the weight off your left leg.  Use crutches. Let me know if you get unstable ale with crutches. You may do better with a walker.  Recheck in 1 week.  Keep your weight off your left leg.  Use tramadol sparingly.  Out of work for a while.   Simple Pelvic Fracture, Adult A pelvic fracture is a break in one of the pelvic bones. The pelvic bones include the bones that you sit on and the bones that make up the lower part of your spine. A pelvic fracture is called simple if the broken bones are stable and are not moving out of place. What are the causes? Common causes of this type of fracture include:  A fall.  A car accident.  Force or pressure applied to the pelvis. What increases the risk? You may be at higher risk for this type of fracture if:  You play high-impact sports.  You are an older person with a condition that causes weak bones (osteoporosis).  You have a bone-weakening disease. What are the signs or symptoms? Signs and symptoms may include:  Tenderness, swelling, or bruising in the affected area.  Pain when moving the hip.  Pain when walking or standing. How is this diagnosed? A diagnosis is made with a physical exam and X-rays. Sometimes, a CT scan is also done. How is this treated? The goal of treatment is to get the bones to heal in a good position. Treatment of a simple pelvic fracture usually involves staying in bed (bed rest) and using crutches or a walker until the bones heal. Medicines may be prescribed for pain. Medicines may also be prescribed that help to prevent blood clots from forming in the legs. Follow these instructions at home: Managing pain, stiffness, and swelling   If directed, apply ice to the injured area:  Put ice in a plastic bag.  Place a towel between your skin and the bag.  Leave the ice on for 20 minutes, 2-3 times a day.  Raise the injured area above the level of your heart while you are sitting  or lying down. Driving   Do not  drive or operate heavy machinery until your health care provider tells you it is safe to do. Activity   Stay on bed rest for as long as directed by your health care provider.  While on bed rest:  Change the position of your legs every 1-2 hours. This keeps blood moving well through both of your legs.  You may sit for as long as you feel comfortable.  After bed rest:  Avoid strenuous activities for as long as directed by your health care provider.  Return to your normal activities as directed by your health care provider. Ask your health care provider what activities are safe for you. Safety   Do not use the injured limb to support your body weight until your health care provider says that you can. Use crutches or a walker as directed by your health care provider. General instructions   Do not use any tobacco products, including cigarettes, chewing tobacco, or electronic cigarettes. Tobacco can delay bone healing. If you need help quitting, ask your health care provider.  Take medicines only as directed by your health care provider.  Keep all follow-up visits as directed by your health care provider. This is important. Contact a health care provider if:  Your pain gets worse.  Your pain is not relieved with medicines. Get  help right away if:  You feel light-headed or faint.  You develop chest pain.  You develop shortness of breath.  You have a fever.  You have blood in your urine or your stools.  You have vaginal bleeding.  You have difficulty or pain with urination or with a bowel movement.  You have difficulty or increased pain with walking.  You have new or increased swelling in one of your legs.  You have numbness in your legs or groin area. This information is not intended to replace advice given to you by your health care provider. Make sure you discuss any questions you have with your health care provider. Document  Released: 05/21/2001 Document Revised: 11/06/2015 Document Reviewed: 11/03/2013 Elsevier Interactive Patient Education  2017 [employer].

## 2016-06-20 ENCOUNTER — Ambulatory Visit (INDEPENDENT_AMBULATORY_CARE_PROVIDER_SITE_OTHER): Payer: 59

## 2016-06-20 ENCOUNTER — Ambulatory Visit (INDEPENDENT_AMBULATORY_CARE_PROVIDER_SITE_OTHER): Payer: 59 | Admitting: Family Medicine

## 2016-06-20 ENCOUNTER — Encounter: Payer: Self-pay | Admitting: Family Medicine

## 2016-06-20 VITALS — BP 139/98 | HR 97

## 2016-06-20 DIAGNOSIS — S32592A Other specified fracture of left pubis, initial encounter for closed fracture: Secondary | ICD-10-CM

## 2016-06-20 DIAGNOSIS — X58XXXD Exposure to other specified factors, subsequent encounter: Secondary | ICD-10-CM | POA: Diagnosis not present

## 2016-06-20 DIAGNOSIS — S32592D Other specified fracture of left pubis, subsequent encounter for fracture with routine healing: Secondary | ICD-10-CM

## 2016-06-20 MED ORDER — TRAMADOL HCL 50 MG PO TABS: 50.0000 mg | ORAL_TABLET | Freq: Three times a day (TID) | ORAL | 0 refills | Status: DC | PRN

## 2016-06-20 NOTE — Patient Instructions (Signed)
Thank you for coming in today. Return in 2 weeks.  Use tramadol sparingly.  Have your FMLA and Short Term Disability paperwork faxed to   [phone] attn Dr Denyse Amassorey

## 2016-06-20 NOTE — Progress Notes (Signed)
   Sara MallardCindy Murray is a 61 y.o. female who [facility] today for follow-up pubic ramus fracture. Patient was seen previously diagnosed with a pubic rami fracture on the left side. In the interim she's been using crutches and notes that her pain is significantly improved. She continues to have some moderate groin pain especially when she has increased her activity.   Past Medical History:  Diagnosis Date  . Chronic bronchitis (HCC)   . Collagenous colitis   . Recovering alcoholic [facility](HCC)    Past Surgical History:  Procedure Laterality Date  . APPENDECTOMY    . CESAREAN SECTION     Social History  Substance Use Topics  . Smoking status: Current Every Day Smoker    Packs/day: 0.50    Years: 40.00    Types: Cigarettes  . Smokeless tobacco: Never Used  . Alcohol use No     Comment: Recovering alcoholic     ROS:  As above   Medications: Current Outpatient Prescriptions  Medication Sig Dispense Refill  . acetaminophen (TYLENOL) 325 MG tablet Take 650 mg by mouth every 6 (six) hours as needed.    Marland Kitchen. aspirin EC 81 MG tablet Take 81 mg by mouth daily.    . MULTIPLE VITAMIN PO Take by mouth.    . predniSONE (STERAPRED UNI-PAK 48 TAB) 5 MG (48) TBPK tablet 12 day dosepack po 48 tablet 0  . traMADol (ULTRAM) 50 MG tablet Take 1 tablet (50 mg total) by mouth every 8 (eight) hours as needed. 15 tablet 0   No current facility-administered medications for this visit.    Allergies  Allergen Reactions  . Keflex [Cephalexin]      Exam:  BP (!) 139/98   Pulse 97  General: Well Developed, well nourished, and in no acute distress.  Neuro/Psych: Alert and oriented x3, extra-ocular muscles intact, able to move all 4 extremities, sensation grossly intact. Skin: Warm and dry, no rashes noted.  Respiratory: Not using accessory muscles, speaking in full sentences, trachea midline.  Cardiovascular: Pulses palpable, no extremity  edema. Abdomen: Does not appear distended. MSK: pain in lt groin with left hip motion  X-ray pelvis: Continue fracture at the left pubic ramus with no further displacement. Awaiting formal radiology review  No results found for this or any previous visit (from the past 48 hour(s)). No results found.    Assessment and Plan: 61 y.o. female with a pubic ramus fracture doing well. Continue limited weightbearing. Tramadol used sparingly. Recheck in 2 weeks. We'll fill out FMLA form or short-term disability forms when available.    Orders Placed This Encounter  Procedures  . DG Pelvis 1-2 Views    Standing Status:   Future    Number of Occurrences:   1    Standing Expiration Date:   08/20/2017    Order Specific Question:   Reason for Exam (SYMPTOM  OR DIAGNOSIS REQUIRED)    Answer:   eval fx pubic rami    Order Specific Question:   Preferred imaging location?    [facility]    Discussed warning signs or symptoms. Please see discharge instructions. Patient expresses understanding.

## 2016-07-04 ENCOUNTER — Ambulatory Visit (INDEPENDENT_AMBULATORY_CARE_PROVIDER_SITE_OTHER): Payer: 59 | Admitting: Family Medicine

## 2016-07-04 ENCOUNTER — Ambulatory Visit (INDEPENDENT_AMBULATORY_CARE_PROVIDER_SITE_OTHER): Payer: 59

## 2016-07-04 VITALS — BP 156/101 | HR 90 | Wt 144.0 lb

## 2016-07-04 DIAGNOSIS — X58XXXD Exposure to other specified factors, subsequent encounter: Secondary | ICD-10-CM | POA: Diagnosis not present

## 2016-07-04 DIAGNOSIS — S32592A Other specified fracture of left pubis, initial encounter for closed fracture: Secondary | ICD-10-CM

## 2016-07-04 DIAGNOSIS — S32592D Other specified fracture of left pubis, subsequent encounter for fracture with routine healing: Secondary | ICD-10-CM

## 2016-07-04 NOTE — Patient Instructions (Signed)
Thank you for coming in today. Get ct scan of the pelvis soon.  Return 1-2 days after the scan to go over results and come up with a plan.  Continue limited weight bearing using crutches.

## 2016-07-04 NOTE — Progress Notes (Signed)
Sara Murray is a 61 y.o. female who presents to [facility] today for follow-up pubic ramus fracture. Patient has been seen several times since March for fracture of the left pubic ramus. In the interim she's done well with limited weightbearing. She notes the pain is improving but is still present with activity.    Past Medical History:  Diagnosis Date  . Chronic bronchitis (HCC)   . Collagenous colitis   . Recovering alcoholic [facility])    Past Surgical History:  Procedure Laterality Date  . APPENDECTOMY    . CESAREAN SECTION     Social History  Substance Use Topics  . Smoking status: Current Every Day Smoker    Packs/day: 0.50    Years: 40.00    Types: Cigarettes  . Smokeless tobacco: Never Used  . Alcohol use No     Comment: Recovering alcoholic     ROS:  As above   Medications: Current Outpatient Prescriptions  Medication Sig Dispense Refill  . acetaminophen (TYLENOL) 325 MG tablet Take 650 mg by mouth every 6 (six) hours as needed.    Marland Kitchen aspirin 325 MG tablet Take 325 mg by mouth daily.    . MULTIPLE VITAMIN PO Take by mouth.    . traMADol (ULTRAM) 50 MG tablet Take 1 tablet (50 mg total) by mouth every 8 (eight) hours as needed. 15 tablet 0   No current facility-administered medications for this visit.    Allergies  Allergen Reactions  . Keflex [Cephalexin]      Exam:  BP (!) 156/101   Pulse 90   Wt 144 lb (65.3 kg)   BMI 23.96 kg/m  General: Well Developed, well nourished, and in no acute distress.  Neuro/Psych: Alert and oriented x3, extra-ocular muscles intact, able to move all 4 extremities, sensation grossly intact. Skin: Warm and dry, no rashes noted.  Respiratory: Not using accessory muscles, speaking in full sentences, trachea midline.  Cardiovascular: Pulses palpable, no extremity edema. Abdomen: Does not appear distended. MSK: Left leg pain in groin with hip motion present.    No results found  for this or any previous visit (from the past 48 hour(s)). Dg Pelvis 1-2 Views  Result Date: 07/04/2016 CLINICAL DATA:  Follow-up of a closed fracture of the left superior and inferior pubic rami. EXAM: PELVIS - 1-2 VIEW COMPARISON:  AP pelvis of June 20, 2016 and June 13, 2016. FINDINGS: The comminuted fractures of the parasymphyseal portions of the superior and inferior pubic rami are again demonstrated. There is a small amount of periosteal reaction. A bony fragment along the superior aspect of the superior pubic ramus is more distracted today than on the previous study. Elsewhere the bony pelvis exhibits no acute or healing fracture. IMPRESSION: There is some ongoing healing of the parasymphyseal fracture of the left superior and inferior pubic rami. Distraction of the a fracture fragment superiorly is greater today than on the previous studie. Electronically Signed   By: David  [place] M.D.   On: 07/04/2016 15:58      Assessment and Plan: 61 y.o. female with slightly worsening appearing fracture. Plan to obtain a CT scan of the pelvis without contrast to evaluate fracture. Return following CT scan. Continue limited or nonweightbearing.    Orders Placed This Encounter  Procedures  . DG Pelvis 1-2 Views    Standing Status:   Future    Number of Occurrences:   1    Standing Expiration Date:   09/03/2017  Order Specific Question:   Reason for Exam (SYMPTOM  OR DIAGNOSIS REQUIRED)    Answer:   eval fx    Order Specific Question:   Preferred imaging location?    [facility]    Order Specific Question:   Radiology Contrast Protocol - do NOT remove file path    Answer:   \\[email or web address]  . CT PELVIS WO CONTRAST    Standing Status:   Future    Standing Expiration Date:   10/03/2017    Order Specific Question:   Reason for Exam (SYMPTOM  OR DIAGNOSIS REQUIRED)    Answer:   eval fx    Order Specific Question:   Preferred imaging  location?    Answer:   [facility]    Order Specific Question:   Radiology Contrast Protocol - do NOT remove file path    Answer:   \\[email or web address]    Discussed warning signs or symptoms. Please see discharge instructions. Patient expresses understanding.

## 2016-07-06 ENCOUNTER — Ambulatory Visit (INDEPENDENT_AMBULATORY_CARE_PROVIDER_SITE_OTHER): Payer: 59

## 2016-07-06 DIAGNOSIS — M84454D Pathological fracture, pelvis, subsequent encounter for fracture with routine healing: Secondary | ICD-10-CM

## 2016-07-06 DIAGNOSIS — S32592A Other specified fracture of left pubis, initial encounter for closed fracture: Secondary | ICD-10-CM

## 2016-07-10 ENCOUNTER — Other Ambulatory Visit: Payer: 59

## 2016-07-12 ENCOUNTER — Ambulatory Visit: Payer: 59 | Admitting: Family Medicine

## 2016-07-13 ENCOUNTER — Ambulatory Visit (INDEPENDENT_AMBULATORY_CARE_PROVIDER_SITE_OTHER): Payer: 59 | Admitting: Family Medicine

## 2016-07-13 ENCOUNTER — Encounter: Payer: Self-pay | Admitting: Family Medicine

## 2016-07-13 VITALS — BP 137/84 | HR 94 | Wt 139.0 lb

## 2016-07-13 DIAGNOSIS — S32592A Other specified fracture of left pubis, initial encounter for closed fracture: Secondary | ICD-10-CM

## 2016-07-13 NOTE — Progress Notes (Signed)
Natassja Ollis is a 61 y.o. female who presents to [facility] today for follow up pelvis fracture.  Anjolaoluwa has been treated for a left pubic rami fracture for about a month. At the last visit on April 11th the fracture appeared to have shifted and a CT was ordered. The CT showed a healing fracture. Ainslee notes that the pain is improving. She is still using crutches but has less pain with partial weight bearing.    Past Medical History:  Diagnosis Date  . Chronic bronchitis (HCC)   . Collagenous colitis   . Recovering alcoholic [facility])    Past Surgical History:  Procedure Laterality Date  . APPENDECTOMY    . CESAREAN SECTION     Social History  Substance Use Topics  . Smoking status: Current Every Day Smoker    Packs/day: 0.50    Years: 40.00    Types: Cigarettes  . Smokeless tobacco: Never Used  . Alcohol use No     Comment: Recovering alcoholic     ROS:  As above   Medications: Current Outpatient Prescriptions  Medication Sig Dispense Refill  . acetaminophen (TYLENOL) 325 MG tablet Take 650 mg by mouth every 6 (six) hours as needed.    Marland Kitchen aspirin 325 MG tablet Take 325 mg by mouth daily.    . MULTIPLE VITAMIN PO Take by mouth.    . traMADol (ULTRAM) 50 MG tablet Take 1 tablet (50 mg total) by mouth every 8 (eight) hours as needed. 15 tablet 0   No current facility-administered medications for this visit.    Allergies  Allergen Reactions  . Keflex [Cephalexin]      Exam:  BP 137/84   Pulse 94   Wt 139 lb (63 kg)   BMI 23.13 kg/m  General: Well Developed, well nourished, and in no acute distress.  Neuro/Psych: Alert and oriented x3, extra-ocular muscles intact, able to move all 4 extremities, sensation grossly intact. Skin: Warm and dry, no rashes noted.  Respiratory: Not using accessory muscles, speaking in full sentences, trachea midline.  Cardiovascular: Pulses palpable, no extremity edema. Abdomen: Does not  appear distended. MSK: pain with hip motion.   Study Result   CLINICAL DATA:  Improving but persistent pubic ramus pain post fracture.  EXAM: CT PELVIS WITHOUT CONTRAST  TECHNIQUE: Multidetector CT imaging of the pelvis was performed following the standard protocol without intravenous contrast.  COMPARISON:  None.  FINDINGS: Urinary Tract:  No abnormality visualized.  Bowel: Nonobstructing bowel large bowel containing umbilical hernia.  Vascular/Lymphatic: No pathologically enlarged lymph nodes. No significant vascular abnormality seen.  Reproductive:  No mass or other significant abnormality  Other:  None.  Musculoskeletal: Bilateral sacral insufficiency fractures with slight coarsened sclerotic appearance of the bony sacrum. Left superior and inferior pubic rami fractures with comminution. Some minimal soft tissue callus is noted without significant bony bridging at this time. Hip joints are maintained.  IMPRESSION: 1. There is some callus formation about known left superior and inferior pubic rami fractures suggesting healing. Incomplete osseous union is noted at this time. 2. Bilateral insufficiency fractures the sacral ala. Slightly coarsened and sclerotic appearance of the sacrum is nonspecific possibly representing sequela of Paget's or chronic repetitive sclerosis secondary to insufficiency fractures.   Electronically Signed   By: Tollie Eth M.D.   On: 07/06/2016 19:00       No results found for this or any previous visit (from the past 48 hour(s)). No results found.  Assessment and Plan: 61 y.o. female with healing pelvis fracture.  Plan to continue limited weight bearing. Will start PT and recheck in 2 weeks.   For possible Pagets disease seen on CT scan will obtain bone density test as well as limited lab workup listed below.     Orders Placed This Encounter  Procedures  . CBC  . COMPLETE METABOLIC PANEL WITH GFR  . TSH    . VITAMIN D 25 Hydroxy (Vit-D Deficiency, Fractures)  . PTH, Intact and Calcium  . Ambulatory referral to Physical Therapy    Referral Priority:   Routine    Referral Type:   Physical Medicine    Referral Reason:   Specialty Services Required    Requested Specialty:   Physical Therapy    Number of Visits Requested:   1    Discussed warning signs or symptoms. Please see discharge instructions. Patient expresses understanding.

## 2016-07-13 NOTE — Patient Instructions (Addendum)
Thank you for coming in today. Attend PT.  Recheck in 2 weeks.  Get labs today.   Recheck sooner if needed.  Advance weight bearing as tolerated.

## 2016-07-17 ENCOUNTER — Other Ambulatory Visit: Payer: 59

## 2016-07-19 ENCOUNTER — Encounter: Payer: Self-pay | Admitting: Physical Therapy

## 2016-07-19 ENCOUNTER — Ambulatory Visit (INDEPENDENT_AMBULATORY_CARE_PROVIDER_SITE_OTHER): Payer: 59 | Admitting: Physical Therapy

## 2016-07-19 DIAGNOSIS — M25552 Pain in left hip: Secondary | ICD-10-CM

## 2016-07-19 DIAGNOSIS — M6281 Muscle weakness (generalized): Secondary | ICD-10-CM

## 2016-07-19 DIAGNOSIS — R2689 Other abnormalities of gait and mobility: Secondary | ICD-10-CM | POA: Diagnosis not present

## 2016-07-19 DIAGNOSIS — R293 Abnormal posture: Secondary | ICD-10-CM

## 2016-07-19 NOTE — Patient Instructions (Addendum)
Knee-to-Chest Stretch: Bilateral    With hands behind knees, pull both knees in to chest until a comfortable stretch is felt in lower back and buttocks. Keep back relaxed. Hold __20-30__ seconds. Repeat _1___ times per set. Do __1__ sets per session. Do __2__ sessions per day.  Knee-to-Chest Stretch: Unilateral    With hand behind right knee, pull knee in to chest until a comfortable stretch is felt in lower back and buttocks. Keep back relaxed. Hold _20-30___ seconds. Repeat _1___ times per set. Do _1___ sets per session. Do ___2_ sessions per day.  Lumbar Rotation (Non-Weight Bearing)    Feet on floor, slowly rock knees from side to side in small, pain-free range of motion. Allow lower back to rotate slightly. Repeat _10___ times per set. Do __1__ sets per session. Do __2__ sessions per day.  Pelvic Tilt    Flatten back by tightening stomach muscles and buttocks. Hold for 5 sec.  Repeat __10__ times per set. Do _1___ sets per session. Do _2___ sessions per day.  Butterfly, Supine    Lie on back, feet together. Lower knees toward floor. Hold __20-30_ seconds. Repeat _1__ times per session. Do _2__ sessions per day.  Copyright  VHI. All rights reserved.

## 2016-07-19 NOTE — Therapy (Signed)
[facility] [place] [address] [place], [place], [address] Phone: [phone]   Fax:  [phone]  Physical Therapy Evaluation  Patient Details  Name: Sara Murray MRN: [id number] Date of Birth: Nov 11, 1955 Referring Provider: Dr Clementeen Graham  Encounter Date: 07/19/2016      PT End of Session - 07/19/16 1443    Visit Number 1   Number of Visits 12   Date for PT Re-Evaluation 08/30/16   Authorization - Visit Number 23   PT Start Time 1444   PT Stop Time 1524   PT Time Calculation (min) 40 min   Activity Tolerance Patient limited by pain      Past Medical History:  Diagnosis Date  . Chronic bronchitis (HCC)   . Collagenous colitis   . Recovering alcoholic [facility])     Past Surgical History:  Procedure Laterality Date  . APPENDECTOMY    . CESAREAN SECTION      There were no vitals filed for this visit.       Subjective Assessment - 07/19/16 1447    Subjective Pt [place] the beach on [place] and everything was good March 17th weekend,  On the 18th awoke with Lt side groin pain. She just healed from Rt hip bursitis and wasn't sure what had happened.  Went to work that week and still had pain and trouble walking.  She was NWB with crutches for 2-3 wks progressed to [facility] and has weaned to no assistive device today.     How long can you sit comfortably? ~ 15 minutes   How long can you walk comfortably? has pain immediately and it increases with distance.    Diagnostic tests x-rays showed Lt rami fx. CT scan because one of the xrays wasn't clear, this showed it is healing.    Patient Stated Goals return to work, learn safe way to walk due to her back issues from the past to not have another hip/pelvis injury.    Currently in Pain? Yes   Pain Score 2   up to 7/10 with walking.    Pain Location Groin   Pain Orientation Left   Pain Descriptors / Indicators Aching  deep   Pain Type Acute pain   Pain Onset More than  a month ago   Pain Frequency Intermittent   Aggravating Factors  walking, bending forward, lifting, side sleeping   Pain Relieving Factors sit, lie down, stretching out straight.             [facility] PT Assessment - 07/19/16 0001      Assessment   Medical Diagnosis Lt ramus closed pelvic fx   Referring Provider Dr Clementeen Graham   Onset Date/Surgical Date 06/10/16   Hand Dominance Right   Next MD Visit 07/27/16   Prior Therapy none     Precautions   Precautions --  WBAT   Precaution Comments anxiety, recovering alcholic, hernia     Balance Screen   Has the patient fallen in the past 6 months No   Has the patient had a decrease in activity level because of a fear of falling?  No   Is the patient reluctant to leave their home because of a fear of falling?  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      Posture/Postural Control   Posture/Postural Control Postural limitations   Postural Limitations Weight shift right;Increased lumbar lordosis  LE valgus     ROM / Strength   AROM / PROM / Strength AROM;Strength     Strength   Strength Assessment Site Hip;Knee;Ankle   Right/Left Hip Right;Left  pain with all AROM of bilat hips   Right Hip Flexion 3+/5   Right Hip ABduction 3+/5   Left Hip Flexion 3-/5   Left Hip ABduction 3+/5   Right/Left Knee --  Rt WNL, Lt  grossly 4+/5   Right/Left Ankle --  bilat WNL     Flexibility   Soft Tissue Assessment /Muscle Length yes   Hamstrings supine SLR Lt 70 degrees with pain,      Ambulation/Gait   Ambulation/Gait Yes   Ambulation/Gait Assistance 6: Modified independent (Device/Increase time)   Ambulation Distance (Feet) --  observed in gym   Assistive device --  presented without AD, recommend and practiced with [employer]   Gait Pattern Step-to pattern;Decreased stance time - left;Decreased step length - right;Left circumduction;Left hip hike;Antalgic;Lateral trunk lean to right                   [facility] Adult PT Treatment/Exercise - 07/19/16 0001      Exercises   Exercises Other Exercises   Other Exercises  SKTC, BKTW, LTR, TA bracing, butterfly stretch.                 PT Education - 07/19/16 1753    Education provided Yes   Education Details HEP   Person(s) Educated Patient   Methods Explanation;Demonstration;Handout   Comprehension Verbalized understanding             PT Long Term Goals - 07/19/16 1758      PT LONG TERM GOAL #1   Title I with advanced HEP ( 08/30/16)    Time 6   Period Weeks   Status New     PT LONG TERM GOAL #2   Title demo bilat hip and lumbar ROM WNL and no pain ( 08/30/16)    Time 6   Period Weeks   Status New     PT LONG TERM GOAL #3   Title demo bilat hip strength =/> 4+/5 to allow for normalized gait patterns ( 08/30/16)    Time 6   Period Weeks   Status New     PT LONG TERM GOAL #4   Title return to work and prior level of activity with minimal to no pain in her Lt groin ( 08/30/16)    Time 6   Period Weeks   Status New     PT LONG TERM GOAL #5   Title improve bilat hamstring flexibility =/> 90 degrees to take pressure off pelvis ( 08/30/16)    Time 6   Period Weeks   Status New     Additional Long Term Goals   Additional Long  Term Goals Yes     PT LONG TERM GOAL #6   Title improve FOTO =/< 42% limited ( 08/30/16)    Time 6    Period Weeks   Status New               Plan - 07/19/16 1754    Clinical Impression Statement 61 yo female presents for moderate complexity PT eval. She has an insideous Lt rami closed fx.  Was NWB for 2-3 weeks and has weaned herself off crutches WBAT. She is ~ 5 weeks out from when she noticed her pain.  She has not had a bone density scan, this has been recommended. She has gait deviations, pain, weakness and stiffness in her hips and core.  Tilley reports she has a h/o back issues and changed her gait causing Rt hip bursitis that just healed when this occurred.  She is wondering if this contributed to her fx.    Rehab Potential Good   PT Frequency 2x / week   PT Duration 6 weeks   PT Treatment/Interventions Moist Heat;Ultrasound;Therapeutic exercise;Therapeutic activities;Dry needling;Taping;Vasopneumatic Device;Manual techniques;Neuromuscular re-education;Cryotherapy;Electrical Stimulation;Iontophoresis /ml Dexamethasone;Patient/family education;Passive range of motion   PT Next Visit Plan gentle ROM, and AROM for strengthening, hips, add in core stabilization    Consulted and Agree with Plan of Care Patient      Patient will benefit from skilled therapeutic intervention in order to improve the following deficits and impairments:  Postural dysfunction, Decreased strength, Decreased mobility, Impaired flexibility, Pain, Decreased range of motion, Abnormal gait  Visit Diagnosis: Pain in left hip - Plan: PT plan of care cert/re-cert  Other abnormalities of gait and mobility - Plan: PT plan of care cert/re-cert  Muscle weakness (generalized) - Plan: PT plan of care cert/re-cert  Abnormal posture - Plan: PT plan of care cert/re-cert     Problem List Patient Active Problem List   Diagnosis Date Noted  . Left groin pain 06/13/2016  . Pubic ramus fracture (HCC) 06/13/2016  . Lateral pain of right hip 05/29/2016    Roderic Scarce PT  07/19/2016, 6:06 PM  [facility] [address] [place], [place], [address] Phone: [phone]   Fax:  [phone]  Name: Sara Murray MRN: [id number] Date of Birth: 1955-11-02

## 2016-07-23 ENCOUNTER — Ambulatory Visit (INDEPENDENT_AMBULATORY_CARE_PROVIDER_SITE_OTHER): Payer: 59 | Admitting: Physical Therapy

## 2016-07-23 DIAGNOSIS — R293 Abnormal posture: Secondary | ICD-10-CM

## 2016-07-23 DIAGNOSIS — M6281 Muscle weakness (generalized): Secondary | ICD-10-CM

## 2016-07-23 DIAGNOSIS — R2689 Other abnormalities of gait and mobility: Secondary | ICD-10-CM

## 2016-07-23 DIAGNOSIS — M25552 Pain in left hip: Secondary | ICD-10-CM

## 2016-07-23 NOTE — Therapy (Addendum)
[place], [name], [address] Phone: [phone]   Fax:  [phone]  Physical Therapy Treatment  Patient Details  Name: Sara Murray MRN: [id number] Date of Birth: 18-Jan-1956 Referring Provider: Dr Lynne Leader  Encounter Date: 07/23/2016      PT End of Session - 07/23/16 1528    PT Start Time 1518   PT Stop Time 1524   PT Time Calculation (min) 6 min      Past Medical History:  Diagnosis Date  . Chronic bronchitis ([place])   . Collagenous colitis   . Recovering alcoholic [facility])     Past Surgical History:  Procedure Laterality Date  . APPENDECTOMY    . CESAREAN SECTION      There were no vitals filed for this visit.      Subjective Assessment - 07/23/16 1528    Subjective Sara Murray arrived today and mentioned to front office staff she was having panic attack.  She was agreeable to sitting in quiet room while she awaited her appointment.  She states she still feels dizzy and worries her anxiety will creep up again, along with Lt side numbness/weakness.  She requests to hold therapy today as she doesn't feel up to it.   She has done a few of her exercises since last visit.    Currently in Pain? Yes   Pain Score 2    Pain Location Groin   Pain Orientation Left   Pain Descriptors / Indicators Aching   Aggravating Factors  walking    Pain Relieving Factors stretching, supine               PT Long Term Goals - 07/19/16 1758      PT LONG TERM GOAL #1   Title I with advanced HEP ( 08/30/16)    Time 6   Period Weeks   Status New     PT LONG TERM GOAL #2   Title demo bilat hip and lumbar ROM WNL and no pain ( 08/30/16)    Time 6   Period Weeks   Status New     PT LONG TERM GOAL #3   Title demo bilat hip strength =/> 4+/5 to allow for normalized gait patterns ( 08/30/16)    Time 6   Period Weeks   Status New     PT LONG TERM GOAL #4   Title return to work and prior level of activity  with minimal to no pain in her Lt groin ( 08/30/16)    Time 6   Period Weeks   Status New     PT LONG TERM GOAL #5   Title improve bilat hamstring flexibility =/> 90 degrees to take pressure off pelvis ( 08/30/16)    Time 6   Period Weeks   Status New     Additional Long Term Goals   Additional Long Term Goals Yes     PT LONG TERM GOAL #6   Title improve FOTO =/< 42% limited ( 08/30/16)    Time 6   Period Weeks   Status New               Plan - 07/23/16 1531    Clinical Impression Statement Pt respectfully declined treatment today; held per pt request.  Pt declined monitoring of BP, as well refused escort to car (re: dizziness/unsteadiness). Encouraged pt to continue gentle hip ROM and HEP exercises until next visit.    Rehab Potential Good  PT Frequency 2x / week   PT Duration 6 weeks   PT Treatment/Interventions Moist Heat;Ultrasound;Therapeutic exercise;Therapeutic activities;Dry needling;Taping;Vasopneumatic Device;Manual techniques;Neuromuscular re-education;Cryotherapy;Electrical Stimulation;Iontophoresis 64m/ml Dexamethasone;Patient/family education;Passive range of motion   PT Next Visit Plan gentle ROM, and AROM for strengthening, hips, add in core stabilization    Consulted and Agree with Plan of Care Patient      Patient will benefit from skilled therapeutic intervention in order to improve the following deficits and impairments:  Postural dysfunction, Decreased strength, Decreased mobility, Impaired flexibility, Pain, Decreased range of motion, Abnormal gait  Visit Diagnosis: Pain in left hip  Other abnormalities of gait and mobility  Muscle weakness (generalized)  Abnormal posture     Problem List Patient Active Problem List   Diagnosis Date Noted  . Left groin pain 06/13/2016  . Pubic ramus fracture ([place] 06/13/2016  . Lateral pain of right hip 05/29/2016   JKerin Perna PTA 07/23/16 3:35 PM  [facility]  [address] [address] [address]Phone: [phone]  Fax:  [phone] Name: Sara HousewrightMRN: [id number] of Birth: 414-Oct-1957  PHYSICAL THERAPY DISCHARGE SUMMARY  Visits from Start of Care: 1  Current functional level related to goals / functional outcomes: unknown   Remaining deficits: Unknown current deficits   Education / Equipment: HEP Plan:                                                    Patient goals were not met. Patient is being discharged due to not returning since the last visit.  ?????Pt seen for initial eval, had a panic attack at second visit and hasnt' returned.     SJeral Pinch PT 08/31/16 8:44 AM

## 2016-07-25 ENCOUNTER — Encounter: Payer: 59 | Admitting: Physical Therapy

## 2016-07-26 ENCOUNTER — Ambulatory Visit (INDEPENDENT_AMBULATORY_CARE_PROVIDER_SITE_OTHER): Payer: 59 | Admitting: Family Medicine

## 2016-07-26 ENCOUNTER — Encounter: Payer: Self-pay | Admitting: Family Medicine

## 2016-07-26 VITALS — BP 139/83 | HR 89 | Ht 65.0 in | Wt 139.0 lb

## 2016-07-26 DIAGNOSIS — S32592A Other specified fracture of left pubis, initial encounter for closed fracture: Secondary | ICD-10-CM | POA: Diagnosis not present

## 2016-07-26 NOTE — Patient Instructions (Signed)
Lets recheck in late May or early June when you return to work.

## 2016-07-27 ENCOUNTER — Ambulatory Visit: Payer: 59 | Admitting: Family Medicine

## 2016-07-27 NOTE — Progress Notes (Signed)
   Sara MallardCindy Gotcher is a 61 y.o. female who [facility] today for follow-up pelvis fracture. Patient has been seen multiple times for fracture of the left pubic ramus starting March 21. She's had slow healing seen on x-ray. She slowly been resuming weightbearing. Today she returns to clinic feeling much better. She notes minimal pain in the anterior groin. She is able to walk without any assistive devices. She seems to be ready to go to work in about a week or 2. She notes pain when she does lots of lifting or pushing or pulling or lots of walking.   Past Medical History:  Diagnosis Date  . Chronic bronchitis (HCC)   . Collagenous colitis   . Recovering alcoholic [facility](HCC)    Past Surgical History:  Procedure Laterality Date  . APPENDECTOMY    . CESAREAN SECTION     Social History  Substance Use Topics  . Smoking status: Current Every Day Smoker    Packs/day: 0.50    Years: 40.00    Types: Cigarettes  . Smokeless tobacco: Never Used  . Alcohol use No     Comment: Recovering alcoholic     ROS:  As above   Medications: Current Outpatient Prescriptions  Medication Sig Dispense Refill  . acetaminophen (TYLENOL) 325 MG tablet Take 650 mg by mouth every 6 (six) hours as needed.    Marland Kitchen. aspirin 325 MG tablet Take 325 mg by mouth daily.    . MULTIPLE VITAMIN PO Take by mouth.    . traMADol (ULTRAM) 50 MG tablet Take 1 tablet (50 mg total) by mouth every 8 (eight) hours as needed. 15 tablet 0   No current facility-administered medications for this visit.    Allergies  Allergen Reactions  . Keflex [Cephalexin]      Exam:  BP 139/83   Pulse 89   Ht 5\' 5"  (1.651 m)   Wt 139 lb (63 kg)   BMI 23.13 kg/m  General: Well Developed, well nourished, and in no acute distress.  Neuro/Psych: Alert and oriented x3, extra-ocular muscles intact, able to move all 4 extremities, sensation grossly intact. Skin: Warm and dry, no rashes noted.    Respiratory: Not using accessory muscles, speaking in full sentences, trachea midline.  Cardiovascular: Pulses palpable, no extremity edema. Abdomen: Does not appear distended. MSK: Anterior pelvis is nontender. Normal hip motion. Mildly antalgic gait    No results found for this or any previous visit (from the past 48 hour(s)). No results found.    Assessment and Plan: 61 y.o. female with recovering pubic ramus fracture. Patient declined x-ray today. Plan to start home physical therapy exercises as patient declined formal physical therapy. Return to work May 14 with restricted duties. Recheck in 3-4 weeks    No orders of the defined types were placed in this encounter.   Discussed warning signs or symptoms. Please see discharge instructions. Patient expresses understanding.

## 2016-07-30 ENCOUNTER — Encounter: Payer: 59 | Admitting: Physical Therapy

## 2016-08-01 ENCOUNTER — Encounter: Payer: 59 | Admitting: Physical Therapy

## 2016-11-09 ENCOUNTER — Ambulatory Visit (INDEPENDENT_AMBULATORY_CARE_PROVIDER_SITE_OTHER): Payer: 59 | Admitting: Sports Medicine

## 2016-11-09 ENCOUNTER — Encounter: Payer: Self-pay | Admitting: Sports Medicine

## 2016-11-09 ENCOUNTER — Ambulatory Visit (INDEPENDENT_AMBULATORY_CARE_PROVIDER_SITE_OTHER): Payer: 59

## 2016-11-09 DIAGNOSIS — K439 Ventral hernia without obstruction or gangrene: Secondary | ICD-10-CM

## 2016-11-09 DIAGNOSIS — M5416 Radiculopathy, lumbar region: Secondary | ICD-10-CM

## 2016-11-09 DIAGNOSIS — S32592A Other specified fracture of left pubis, initial encounter for closed fracture: Secondary | ICD-10-CM

## 2016-11-09 DIAGNOSIS — M4316 Spondylolisthesis, lumbar region: Secondary | ICD-10-CM | POA: Diagnosis not present

## 2016-11-09 MED ORDER — PREDNISONE 50 MG PO TABS: ORAL_TABLET | ORAL | 0 refills | Status: DC

## 2016-11-09 MED ORDER — MELOXICAM 15 MG PO TABS: ORAL_TABLET | ORAL | 3 refills | Status: DC

## 2016-11-09 NOTE — Assessment & Plan Note (Signed)
Symptoms seemingly resolved

## 2016-11-09 NOTE — Assessment & Plan Note (Signed)
History of laparotomy, now with ventral hernia. Referral to plastic surgery for consideration of repair. She will establish with one of my partners for primary care.

## 2016-11-09 NOTE — Assessment & Plan Note (Addendum)
Prednisone, meloxicam, patient declines formal PT, adding home rehabilitation exercises. Lumbar spine x-rays. Pelvic CT does show some vacuum disc phenomenon at the L5-S1 level with several levels of retrolisthesis, degenerative disc disease, ligamentum flavum hypertrophy, and lower level facet arthropathy. Return in 6 weeks, MRI for interventional planning if no better, left L5 distribution.

## 2016-11-09 NOTE — Progress Notes (Signed)
  Subjective:    CC: Low back pain  HPI: This is a pleasant 61 year old female, she saw Dr. Denyse Amass for pelvic pain sometime ago, a CT showed sacral insufficiency fractures, this resolved. The CT did show multiple other degenerative processes. Since then she's had pain in her back, worse with sitting, flexion, Valsalva with radiation down the left leg in an L5 distribution, no bowel or bladder dysfunction, saddle numbness, constitutional symptoms.  She also has a history of an appendectomy with laparotomy, since then has developed a ventral hernia. No obstruction, no pain.  Past medical history:  Negative.  See flowsheet/record as well for more information.  Surgical history: Negative.  See flowsheet/record as well for more information.  Family history: Negative.  See flowsheet/record as well for more information.  Social history: Negative.  See flowsheet/record as well for more information.  Allergies, and medications have been entered into the medical record, reviewed, and no changes needed.   Review of Systems: No fevers, chills, night sweats, weight loss, chest pain, or shortness of breath.   Objective:    General: Well Developed, well nourished, and in no acute distress.  Neuro: Alert and oriented x3, extra-ocular muscles intact, sensation grossly intact.  HEENT: Normocephalic, atraumatic, pupils equal round reactive to light, neck supple, no masses, no lymphadenopathy, thyroid nonpalpable.  Skin: Warm and dry, no rashes. Cardiac: Regular rate and rhythm, no murmurs rubs or gallops, no lower extremity edema.  Respiratory: Clear to auscultation bilaterally. Not using accessory muscles, speaking in full sentences. Back Exam:  Inspection: Unremarkable  Motion: Flexion 45 deg, Extension 45 deg, Side Bending to 45 deg bilaterally,  Rotation to 45 deg bilaterally  SLR laying: Negative  XSLR laying: Negative  Palpable tenderness: None. FABER: negative. Sensory change: Gross sensation  intact to all lumbar and sacral dermatomes.  Reflexes: 2+ at both patellar tendons, 2+ at achilles tendons, Babinski's downgoing.  Strength at foot  Plantar-flexion: 5/5 Dorsi-flexion: 5/5 Eversion: 5/5 Inversion: 5/5  Leg strength  Quad: 5/5 Hamstring: 5/5 Hip flexor: 5/5 Hip abductors: 5/5  Gait unremarkable.  Pelvic CT does show some vacuum disc phenomenon at the L5-S1 level with several levels of retrolisthesis, degenerative disc disease, ligamentum flavum hypertrophy, and lower level facet arthropathy.  Impression and Recommendations:    Left lumbar radiculitis Prednisone, meloxicam, patient declines formal PT, adding home rehabilitation exercises. Lumbar spine x-rays. Pelvic CT does show some vacuum disc phenomenon at the L5-S1 level with several levels of retrolisthesis, degenerative disc disease, ligamentum flavum hypertrophy, and lower level facet arthropathy. Return in 6 weeks, MRI for interventional planning if no better, left L5 distribution.  Pubic ramus fracture (HCC) Symptoms seemingly resolved  Ventral hernia History of laparotomy, now with ventral hernia. Referral to plastic surgery for consideration of repair. She will establish with one of my partners for primary care.

## 2016-11-14 ENCOUNTER — Telehealth: Payer: Self-pay

## 2016-11-14 NOTE — Telephone Encounter (Signed)
Pt states she is in need of a work note for August 17th and 20th. Please advise.

## 2016-11-15 ENCOUNTER — Encounter: Payer: Self-pay | Admitting: Sports Medicine

## 2016-11-15 NOTE — Telephone Encounter (Signed)
Letter in box. 

## 2016-11-15 NOTE — Telephone Encounter (Signed)
Pt.notified

## 2016-11-22 ENCOUNTER — Encounter: Payer: Self-pay | Admitting: Sports Medicine

## 2016-11-22 ENCOUNTER — Encounter: Payer: Self-pay | Admitting: Physician Assistant

## 2016-11-22 ENCOUNTER — Ambulatory Visit (INDEPENDENT_AMBULATORY_CARE_PROVIDER_SITE_OTHER): Payer: 59 | Admitting: Physician Assistant

## 2016-11-22 VITALS — BP 111/76 | HR 75 | Resp 14 | Wt 140.0 lb

## 2016-11-22 DIAGNOSIS — F1011 Alcohol abuse, in remission: Secondary | ICD-10-CM

## 2016-11-22 DIAGNOSIS — Z131 Encounter for screening for diabetes mellitus: Secondary | ICD-10-CM | POA: Diagnosis not present

## 2016-11-22 DIAGNOSIS — R1011 Right upper quadrant pain: Secondary | ICD-10-CM

## 2016-11-22 DIAGNOSIS — Z7689 Persons encountering health services in other specified circumstances: Secondary | ICD-10-CM

## 2016-11-22 DIAGNOSIS — Z789 Other specified health status: Secondary | ICD-10-CM | POA: Diagnosis not present

## 2016-11-22 DIAGNOSIS — Z13220 Encounter for screening for lipoid disorders: Secondary | ICD-10-CM

## 2016-11-22 DIAGNOSIS — Z87891 Personal history of nicotine dependence: Secondary | ICD-10-CM

## 2016-11-22 DIAGNOSIS — Z87898 Personal history of other specified conditions: Secondary | ICD-10-CM | POA: Diagnosis not present

## 2016-11-22 DIAGNOSIS — Z1211 Encounter for screening for malignant neoplasm of colon: Secondary | ICD-10-CM | POA: Diagnosis not present

## 2016-11-22 DIAGNOSIS — G8929 Other chronic pain: Secondary | ICD-10-CM

## 2016-11-22 DIAGNOSIS — Z136 Encounter for screening for cardiovascular disorders: Secondary | ICD-10-CM

## 2016-11-22 DIAGNOSIS — K52831 Collagenous colitis: Secondary | ICD-10-CM | POA: Diagnosis not present

## 2016-11-22 DIAGNOSIS — Z1159 Encounter for screening for other viral diseases: Secondary | ICD-10-CM

## 2016-11-22 DIAGNOSIS — J449 Chronic obstructive pulmonary disease, unspecified: Secondary | ICD-10-CM | POA: Insufficient documentation

## 2016-11-22 DIAGNOSIS — Z1231 Encounter for screening mammogram for malignant neoplasm of breast: Secondary | ICD-10-CM

## 2016-11-22 DIAGNOSIS — I358 Other nonrheumatic aortic valve disorders: Secondary | ICD-10-CM

## 2016-11-22 DIAGNOSIS — Z130 Encounter for screening for diseases of the blood and blood-forming organs and certain disorders involving the immune mechanism: Secondary | ICD-10-CM

## 2016-11-22 MED ORDER — CALCIUM CARBONATE-VITAMIN D 600-400 MG-UNIT PO TABS: 1.0000 | ORAL_TABLET | Freq: Two times a day (BID) | ORAL | 11 refills | Status: DC

## 2016-11-22 NOTE — Progress Notes (Signed)
HPI:                                                                Sara Murray is a 61 y.o. female who presents to [facility] today to establish care  Current concerns include: "liver health"  Patient reports history of alcohol abuse; has been sober for 11 years. Also reports history of hepatitis A years ago. She states she has been taking Tylenol daily for years for chronic back pain. All of this makes her concerned about her liver. She reports intermittent, vague RUQ pain present for over a year. She still has her gallbladder. She had a CT Abd/Pelvis in June 2013 for appendicitis, which showed unremarkable liver with small to moderate amount of ascites.   Past Medical History:  Diagnosis Date  . Chronic bronchitis (HCC)   . Collagenous colitis   . Recovering alcoholic [facility])    Past Surgical History:  Procedure Laterality Date  . APPENDECTOMY    . BREAST ENHANCEMENT SURGERY    . CESAREAN SECTION  1990  . CESAREAN SECTION  1992   Social History  Substance Use Topics  . Smoking status: Former Smoker    Packs/day: 0.50    Years: 40.00    Types: Cigarettes    Quit date: 07/01/2015  . Smokeless tobacco: Never Used  . Alcohol use No     Comment: Recovering alcoholic   family history includes Cancer in her maternal grandfather; Colon polyps in her father; Stroke in her father.  ROS: negative except as noted in the HPI  Medications: Current Outpatient Prescriptions  Medication Sig Dispense Refill  . acetaminophen (TYLENOL) 325 MG tablet Take 650 mg by mouth every 6 (six) hours as needed.    . meloxicam (MOBIC) 15 MG tablet One tab PO qAM with breakfast for 2 weeks, then daily prn pain. 30 tablet 3  . Calcium Carbonate-Vitamin D 600-400 MG-UNIT tablet Take 1 tablet by mouth 2 (two) times daily. 60 tablet 11   No current facility-administered medications for this visit.    Allergies  Allergen Reactions  . Keflex [Cephalexin]         Objective:  BP 111/76   Pulse 75   Resp 14   Wt 140 lb (63.5 kg)   SpO2 99%   BMI 23.30 kg/m  Gen:  alert, not ill-appearing, no distress, appropriate for age HEENT: head normocephalic without obvious abnormality, conjunctiva and cornea clear, trachea midline Pulm: Normal work of breathing, normal phonation, clear to ausculation bilaterally CV: normal rate, regular rhythm, s1 and s2 distinct, grade II/VI systolic murmur heard best at the RUSB Abdomen: soft, nondistended, large ventral hernia, well-healed longitudinal surgical incision, there is RUQ tenderness, positive Murphy's sign Neuro: alert and oriented x 3, normal tone, no tremor MSK: extremities atraumatic, normal gait and station Skin: intact, no rashes on exposed skin, no jaundice, no cyanosis Psych: well-groomed, cooperative, good eye contact, euthymic mood, affect mood-congruent, speech is articulate, and thought processes clear and goal-directed    No results found for this or any previous visit (from the past 72 hour(s)). No results found.  Depression screen [facility] 2/9 11/22/2016 07/04/2016  Decreased Interest 1 1  Down, Depressed, Hopeless 1 1  PHQ - 2 Score  2 2     Assessment and Plan: 61 y.o. female with   1. Encounter to establish care - reviewed PMH, PSH, PFH - reviewed Health maintenance: due for Colonoscopy, Mammogram, Pap smear - declined influenza vaccine - Tdap UTD per patient - negative PHQ2 - ordering fasting labs for patient's annual exam   2. Collagenous colitis - referral to GI placed  3. Electronic cigarette use  4. Former heavy tobacco smoker - 40 packyear smoking history - meets criteria for lung cancer screening w/ low-dose chest CT  5. History of alcohol abuse - CMP - US Abdomen Limited RUQ; Future  6. Chronic RUQ pain - Comprehensive metabolic panel - US Abdomen Limited RUQ; Future  7. Aortic systolic murmur on examination - patient denies history of murmur. She is  asymptomatic - no syncope, lightheadedness, decreased exercise tolerance, fatigue - plan to obtain Echo at a later date. Patient does not want to do imaging at this time  8. Colon cancer screening - Ambulatory referral to Gastroenterology  9. Breast cancer screening by mammogram - MM DIGITAL SCREENING W/ IMPLANTS BILATERAL; Future  10. Encounter for lipid screening for cardiovascular disease - Lipid Panel w/reflex Direct LDL  11. Screening for iron deficiency anemia - CBC  12. Screening for diabetes mellitus - Comprehensive metabolic panel  13. Encounter for hepatitis C screening test for low risk patient - Hepatitis C antibody  Patient education and anticipatory guidance given Patient agrees with treatment plan Follow-up in 2 weeks for CPE w/Pap or sooner as needed   Levonne Hubertharley E. Cummings PA-C

## 2016-11-22 NOTE — Patient Instructions (Signed)
Thank you for coming in today  I have ordered fasting labs for your annual physical exam. The lab is a walk-in open M-F 7:30a-4:30p (closed 12:30-1:30p). Nothing to eat or drink after midnight or at least 8 hours before your blood draw. You can have water and your medications.    Preventive Care 40-64 Years, Female Preventive care refers to lifestyle choices and visits with your health care provider that can promote health and wellness. What does preventive care include?  A yearly physical exam. This is also called an annual well check.  Dental exams once or twice a year.  Routine eye exams. Ask your health care provider how often you should have your eyes checked.  Personal lifestyle choices, including: ? Daily care of your teeth and gums. ? Regular physical activity. ? Eating a healthy diet. ? Avoiding tobacco and drug use. ? Limiting alcohol use. ? Practicing safe sex. ? Taking low-dose aspirin daily starting at age 16. ? Taking vitamin and mineral supplements as recommended by your health care provider. What happens during an annual well check? The services and screenings done by your health care provider during your annual well check will depend on your age, overall health, lifestyle risk factors, and family history of disease. Counseling Your health care provider may ask you questions about your:  Alcohol use.  Tobacco use.  Drug use.  Emotional well-being.  Home and relationship well-being.  Sexual activity.  Eating habits.  Work and work Statistician.  Method of birth control.  Menstrual cycle.  Pregnancy history.  Screening You may have the following tests or measurements:  Height, weight, and BMI.  Blood pressure.  Lipid and cholesterol levels. These may be checked every 5 years, or more frequently if you are over 50 years old.  Skin check.  Lung cancer screening. You may have this screening every year starting at age 12 if you have a  30-pack-year history of smoking and currently smoke or have quit within the past 15 years.  Fecal occult blood test (FOBT) of the stool. You may have this test every year starting at age 109.  Flexible sigmoidoscopy or colonoscopy. You may have a sigmoidoscopy every 5 years or a colonoscopy every 10 years starting at age 87.  Hepatitis C blood test.  Hepatitis B blood test.  Sexually transmitted disease (STD) testing.  Diabetes screening. This is done by checking your blood sugar (glucose) after you have not eaten for a while (fasting). You may have this done every 1-3 years.  Mammogram. This may be done every 1-2 years. Talk to your health care provider about when you should start having regular mammograms. This may depend on whether you have a family history of breast cancer.  BRCA-related cancer screening. This may be done if you have a family history of breast, ovarian, tubal, or peritoneal cancers.  Pelvic exam and Pap test. This may be done every 3 years starting at age 10. Starting at age 80, this may be done every 5 years if you have a Pap test in combination with an HPV test.  Bone density scan. This is done to screen for osteoporosis. You may have this scan if you are at high risk for osteoporosis.  Discuss your test results, treatment options, and if necessary, the need for more tests with your health care provider. Vaccines Your health care provider may recommend certain vaccines, such as:  Influenza vaccine. This is recommended every year.  Tetanus, diphtheria, and acellular pertussis (Tdap, Td) vaccine.  You may need a Td booster every 10 years.  Varicella vaccine. You may need this if you have not been vaccinated.  Zoster vaccine. You may need this after age 48.  Measles, mumps, and rubella (MMR) vaccine. You may need at least one dose of MMR if you were born in 1957 or later. You may also need a second dose.  Pneumococcal 13-valent conjugate (PCV13) vaccine. You may  need this if you have certain conditions and were not previously vaccinated.  Pneumococcal polysaccharide (PPSV23) vaccine. You may need one or two doses if you smoke cigarettes or if you have certain conditions.  Meningococcal vaccine. You may need this if you have certain conditions.  Hepatitis A vaccine. You may need this if you have certain conditions or if you travel or work in places where you may be exposed to hepatitis A.  Hepatitis B vaccine. You may need this if you have certain conditions or if you travel or work in places where you may be exposed to hepatitis B.  Haemophilus influenzae type b (Hib) vaccine. You may need this if you have certain conditions.  Talk to your health care provider about which screenings and vaccines you need and how often you need them. This information is not intended to replace advice given to you by your health care provider. Make sure you discuss any questions you have with your health care provider. Document Released: 04/08/2015 Document Revised: 11/30/2015 Document Reviewed: 01/11/2015 Elsevier Interactive Patient Education  2017 [employer].

## 2016-11-29 ENCOUNTER — Telehealth: Payer: Self-pay

## 2016-11-29 NOTE — Telephone Encounter (Signed)
Pt would like to know if you could remove the "No bending, stooping, or crawling". Pt states her job will not accept that and if it could say reasonable amount of those things she should be ok. Please assist.

## 2016-11-30 ENCOUNTER — Encounter: Payer: Self-pay | Admitting: Sports Medicine

## 2016-11-30 NOTE — Telephone Encounter (Signed)
Left VM

## 2016-11-30 NOTE — Telephone Encounter (Signed)
Letter in box. 

## 2016-12-05 ENCOUNTER — Ambulatory Visit: Payer: 59

## 2016-12-05 ENCOUNTER — Other Ambulatory Visit: Payer: 59

## 2017-01-02 ENCOUNTER — Ambulatory Visit: Payer: 59

## 2017-01-03 ENCOUNTER — Encounter: Payer: 59 | Admitting: Sports Medicine

## 2017-01-24 ENCOUNTER — Encounter: Payer: Self-pay | Admitting: Physician Assistant

## 2017-02-06 ENCOUNTER — Ambulatory Visit (INDEPENDENT_AMBULATORY_CARE_PROVIDER_SITE_OTHER): Payer: 59

## 2017-02-06 DIAGNOSIS — R1011 Right upper quadrant pain: Secondary | ICD-10-CM

## 2017-02-06 DIAGNOSIS — Z1231 Encounter for screening mammogram for malignant neoplasm of breast: Secondary | ICD-10-CM

## 2017-02-06 DIAGNOSIS — G8929 Other chronic pain: Secondary | ICD-10-CM

## 2017-02-06 DIAGNOSIS — F1021 Alcohol dependence, in remission: Secondary | ICD-10-CM

## 2017-02-06 DIAGNOSIS — F1011 Alcohol abuse, in remission: Secondary | ICD-10-CM

## 2017-02-06 NOTE — Progress Notes (Signed)
Normal liver and gallbladder on ultrasound Recommend continuing to abstain from alcohol. Okay to continue Tylenol as needed.

## 2017-02-06 NOTE — Progress Notes (Signed)
Negative mammogram. Repeat screening in 1 year

## 2017-06-05 ENCOUNTER — Emergency Department
Admission: EM | Admit: 2017-06-05 | Discharge: 2017-06-05 | Disposition: A | Discharge status: Home / Self Care | Payer: 59 | Source: Home / Self Care | Attending: Emergency Medicine | Admitting: Emergency Medicine

## 2017-06-05 ENCOUNTER — Other Ambulatory Visit: Payer: Self-pay

## 2017-06-05 ENCOUNTER — Encounter: Payer: Self-pay | Admitting: Emergency Medicine

## 2017-06-05 DIAGNOSIS — F410 Panic disorder [episodic paroxysmal anxiety] without agoraphobia: Secondary | ICD-10-CM | POA: Diagnosis not present

## 2017-06-05 MED ORDER — HYDROXYZINE HCL 25 MG PO TABS: ORAL_TABLET | ORAL | 0 refills | Status: DC

## 2017-06-05 NOTE — ED Triage Notes (Signed)
Patient reports throbbing head pressure on right side for past 3-4 days; got home from work last night at 2300 and could not sleep and feels very anxious today.

## 2017-06-05 NOTE — ED Provider Notes (Signed)
[facility]    CSN: [id number] Arrival date & time: 06/05/17  4098     History   Chief Complaint Chief Complaint  Patient presents with  . Panic Attack  . Hypertension    HPI Sara Murray is a 63 y.o. female.  Patient enters feeling anxious.he has had a mild right sided headache for 3 few days. She worked until 10 PM last night. She  felt anxious through the night. She has a history of panic disorder treated with hydroxyzine in the past.  Hypertension  Associated symptoms include shortness of breath.    Past Medical History:  Diagnosis Date  . Chronic bronchitis (HCC)   . Collagenous colitis   . Recovering alcoholic [facility])     Patient Active Problem List   Diagnosis Date Noted  . Collagenous colitis 11/22/2016  . Chronic bronchitis with COPD (chronic obstructive pulmonary disease) (HCC) 11/22/2016  . Electronic cigarette use 11/22/2016  . History of alcohol abuse 11/22/2016  . Chronic RUQ pain 11/22/2016  . Former heavy tobacco smoker 11/22/2016  . Aortic systolic murmur on examination 11/22/2016  . Left lumbar radiculitis 11/09/2016  . Ventral hernia 11/09/2016  . Pubic ramus fracture (HCC) 06/13/2016    Past Surgical History:  Procedure Laterality Date  . APPENDECTOMY    . AUGMENTATION MAMMAPLASTY    . BREAST ENHANCEMENT SURGERY    . CESAREAN SECTION  1990  . CESAREAN SECTION  1992    OB History    No data available       Home Medications    Prior to Admission medications   Medication Sig Start Date End Date Taking? Authorizing Provider  acetaminophen (TYLENOL) 325 MG tablet Take 650 mg by mouth every 6 (six) hours as needed.    [provider]  Calcium Carbonate-Vitamin D 600-400 MG-UNIT tablet Take 1 tablet by mouth 2 (two) times daily. 11/22/16   Carlis Stable, PA-C  meloxicam (MOBIC) 15 MG tablet One tab PO qAM with breakfast for 2 weeks, then daily prn pain. 11/09/16   Monica Becton, MD    Family  History Family History  Problem Relation Age of Onset  . Stroke Father   . Colon polyps Father   . Cancer Maternal Grandfather     Social History Social History   Tobacco Use  . Smoking status: Former Smoker    Packs/day: 0.50    Years: 40.00    Pack years: 20.00    Types: Cigarettes    Last attempt to quit: 07/01/2015    Years since quitting: 1.9  . Smokeless tobacco: Never Used  Substance Use Topics  . Alcohol use: No    Comment: Recovering alcoholic  . Drug use: No     Allergies   Keflex [cephalexin]   Review of Systems Review of Systems  Constitutional: Positive for unexpected weight change.  HENT: Negative.   Respiratory: Positive for shortness of breath. Negative for cough.   Cardiovascular: Negative.   Gastrointestinal:       She has abdominal pain with recurrent ventral hernia.  Genitourinary: Negative.      Physical Exam Triage Vital Signs ED Triage Vitals  Enc Vitals Group     BP 06/05/17 0931 (!) 159/103     Pulse Rate 06/05/17 0931 (!) 107     Resp 06/05/17 0931 (!) 22     Temp 06/05/17 0931 98 F (36.7 C)     Temp Source 06/05/17 0931 Oral     SpO2 06/05/17 0931  100 %     Weight 06/05/17 0934 139 lb (63 kg)     Height 06/05/17 0932 5\' 5"  (1.651 m)     Head Circumference --      Peak Flow --      Pain Score --      Pain Loc --      Pain Edu? --      Excl. in GC? --    No data found.  Updated Vital Signs BP 120/85 (BP Location: Right Arm)   Pulse (!) 107   Temp 98 F (36.7 C) (Oral)   Resp (!) 22   Ht 5\' 5"  (1.651 m)   Wt 139 lb (63 kg)   SpO2 100%   BMI 23.13 kg/m   Visual Acuity Right Eye Distance:   Left Eye Distance:   Bilateral Distance:    Right Eye Near:   Left Eye Near:    Bilateral Near:     Physical Exam  Constitutional: She is oriented to person, place, and time. She appears well-developed and well-nourished. She appears distressed.  HENT:  Head: Normocephalic and atraumatic.  Eyes: Pupils are equal, round,  and reactive to light.  Neck: Normal range of motion.  Cardiovascular: Normal rate and regular rhythm.  No murmur heard. Pulmonary/Chest:  Breath sounds symmetrical. Decreased breath sounds in bases.  Abdominal:  There is a large midline scar with recurrent ventral hernia.  Neurological: She is alert and oriented to person, place, and time. No cranial nerve deficit. She exhibits normal muscle tone.  Skin: Skin is warm and dry.  Psychiatric:  Patient is anxious but cooperative.     UC Treatments / Results  Labs (all labs ordered are listed, but only abnormal results are displayed) Labs Reviewed - No data to display  EKG  EKG Interpretation None       Radiology No results found.  Procedures Procedures (including critical care time)  Medications Ordered in UC Medications - No data to display   Initial Impression / Assessment and Plan / UC Course  I have reviewed the triage vital signs and the nursing notes.  Pertinent labs & imaging results that were available during my care of the patient were reviewed by me and considered in my medical decision making (see chart for details). With time patient's blood pressure returned to normal and symptoms resolved.she was having a panic attack. She was encourage to follow-up with her PCP to have the studies and blood work done that were recommended.A prescription was given for Atarax to take if needed.      Final Clinical Impressions(s) / UC Diagnoses   Final diagnoses:  Anxiety attack    ED Discharge Orders    None     Please make an appointment at the family medicine Center for reevaluation  Controlled Substance Prescriptions [place] Controlled Substance Registry consulted? Not Applicable   Collene Gobbleaub, David Towson A, MD 06/05/17 [phone]

## 2017-06-05 NOTE — Discharge Instructions (Signed)
Please make an appointment at the family medicine Center for reevaluation

## 2017-06-05 NOTE — ED Notes (Signed)
Patient much more relaxed; smiling. Spoke to her about suggested testing from 11/22/16 visit and how to renew orders.

## 2017-06-05 NOTE — ED Notes (Signed)
Patient much more relaxed; smiling. Spoke to her about follow up on suggested testing from

## 2018-07-30 ENCOUNTER — Encounter: Payer: Self-pay | Admitting: *Deleted

## 2018-07-30 ENCOUNTER — Emergency Department (INDEPENDENT_AMBULATORY_CARE_PROVIDER_SITE_OTHER)
Admission: EM | Admit: 2018-07-30 | Discharge: 2018-07-30 | Disposition: A | Discharge status: Home / Self Care | Payer: Medicare Other | Source: Home / Self Care

## 2018-07-30 ENCOUNTER — Other Ambulatory Visit: Payer: Self-pay

## 2018-07-30 DIAGNOSIS — J040 Acute laryngitis: Secondary | ICD-10-CM | POA: Diagnosis not present

## 2018-07-30 MED ORDER — PREDNISONE 50 MG PO TABS: 50.0000 mg | ORAL_TABLET | Freq: Every day | ORAL | 0 refills | Status: AC

## 2018-07-30 NOTE — ED Provider Notes (Signed)
[facility]    CSN: [id number] Arrival date & time: 07/30/18  0918     History   Chief Complaint Chief Complaint  Patient presents with  . Hoarse    HPI Sara Murray is a 63 y.o. female.   HPI  Sara Murray is a 63 y.o. female presenting to UC with c/o mild cough with clear sputum and post-nasal drip for 2 weeks. Sore throat started yesterday with associated hoarse voice. She has taken Claritin w/o relief. Denies fever, chills, n/v/d. Denies chest pain or SOB. No recent travel or known sick contacts.    Past Medical History:  Diagnosis Date  . Chronic bronchitis (HCC)   . Collagenous colitis   . Recovering alcoholic [facility])     Patient Active Problem List   Diagnosis Date Noted  . Collagenous colitis 11/22/2016  . Chronic bronchitis with COPD (chronic obstructive pulmonary disease) (HCC) 11/22/2016  . Electronic cigarette use 11/22/2016  . History of alcohol abuse 11/22/2016  . Chronic RUQ pain 11/22/2016  . Former heavy tobacco smoker 11/22/2016  . Aortic systolic murmur on examination 11/22/2016  . Left lumbar radiculitis 11/09/2016  . Ventral hernia 11/09/2016  . Pubic ramus fracture (HCC) 06/13/2016    Past Surgical History:  Procedure Laterality Date  . APPENDECTOMY    . AUGMENTATION MAMMAPLASTY    . BREAST ENHANCEMENT SURGERY    . CESAREAN SECTION  1990  . CESAREAN SECTION  1992    OB History   No obstetric history on file.      Home Medications    Prior to Admission medications   Medication Sig Start Date End Date Taking? Authorizing Provider  acetaminophen (TYLENOL) 325 MG tablet Take 650 mg by mouth every 6 (six) hours as needed.   Yes [provider]  predniSONE (DELTASONE) 50 MG tablet Take 1 tablet (50 mg total) by mouth daily with breakfast for 5 days. 07/30/18 08/04/18  Lurene Shadow, PA-C    Family History Family History  Problem Relation Age of Onset  . Stroke Father   . Colon polyps Father   . Cancer Maternal  Grandfather     Social History Social History   Tobacco Use  . Smoking status: Former Smoker    Packs/day: 0.50    Years: 40.00    Pack years: 20.00    Types: Cigarettes    Last attempt to quit: 07/01/2015    Years since quitting: 3.0  . Smokeless tobacco: Never Used  Substance Use Topics  . Alcohol use: No    Comment: Recovering alcoholic  . Drug use: No     Allergies   Keflex [cephalexin]   Review of Systems Review of Systems  Constitutional: Negative for chills and fever.  HENT: Positive for postnasal drip, sore throat and voice change. Negative for congestion, ear pain and trouble swallowing.   Respiratory: Positive for cough ( mild). Negative for shortness of breath.   Cardiovascular: Negative for chest pain and palpitations.  Gastrointestinal: Negative for abdominal pain, diarrhea, nausea and vomiting.  Musculoskeletal: Negative for arthralgias, back pain and myalgias.  Skin: Negative for rash.     Physical Exam Triage Vital Signs ED Triage Vitals  Enc Vitals Group     BP 07/30/18 0937 133/82     Pulse Rate 07/30/18 0937 93     Resp 07/30/18 0937 18     Temp 07/30/18 0937 98.5 F (36.9 C)     Temp Source 07/30/18 0937 Oral     SpO2  07/30/18 0937 99 %     Weight 07/30/18 0938 118 lb (53.5 kg)     Height 07/30/18 0938 5\' 5"  (1.651 m)     Head Circumference --      Peak Flow --      Pain Score 07/30/18 0938 0     Pain Loc --      Pain Edu? --      Excl. in GC? --    No data found.  Updated Vital Signs BP 133/82 (BP Location: Right Arm)   Pulse 93   Temp 98.5 F (36.9 C) (Oral)   Resp 18   Ht 5\' 5"  (1.651 m)   Wt 118 lb (53.5 kg)   SpO2 99%   BMI 19.64 kg/m   Visual Acuity Right Eye Distance:   Left Eye Distance:   Bilateral Distance:    Right Eye Near:   Left Eye Near:    Bilateral Near:     Physical Exam Vitals signs and nursing note reviewed.  Constitutional:      Appearance: Normal appearance. She is well-developed.  HENT:      Head: Normocephalic and atraumatic.     Right Ear: Tympanic membrane normal.     Left Ear: Tympanic membrane normal.     Nose: Nose normal.     Right Sinus: No maxillary sinus tenderness or frontal sinus tenderness.     Left Sinus: No maxillary sinus tenderness or frontal sinus tenderness.     Mouth/Throat:     Lips: Pink.     Mouth: Mucous membranes are moist.     Pharynx: Oropharynx is clear. Uvula midline. No pharyngeal swelling, oropharyngeal exudate or posterior oropharyngeal erythema.     Tonsils: No tonsillar exudate.  Neck:     Musculoskeletal: Normal range of motion.  Cardiovascular:     Rate and Rhythm: Normal rate and regular rhythm.  Pulmonary:     Effort: Pulmonary effort is normal. No respiratory distress.     Breath sounds: Normal breath sounds. No stridor. No wheezing or rhonchi.     Comments: Hoarse voice w/o stridor  Musculoskeletal: Normal range of motion.  Skin:    General: Skin is warm and dry.  Neurological:     Mental Status: She is alert and oriented to person, place, and time.  Psychiatric:        Behavior: Behavior normal.      UC Treatments / Results  Labs (all labs ordered are listed, but only abnormal results are displayed) Labs Reviewed - No data to display  EKG None  Radiology No results found.  Procedures Procedures (including critical care time)  Medications Ordered in UC Medications - No data to display  Initial Impression / Assessment and Plan / UC Course  I have reviewed the triage vital signs and the nursing notes.  Pertinent labs & imaging results that were available during my care of the patient were reviewed by me and considered in my medical decision making (see chart for details).     Exam not c/w strep Hx and exam c/w laryngitis No evidence of bacterial infection at this time. Encouraged symptomatic tx.  Final Clinical Impressions(s) / UC Diagnoses   Final diagnoses:  Laryngitis     Discharge Instructions       You may take 500mg  acetaminophen every 4-6 hours or in combination with ibuprofen 400mg  every 6-8 hours as needed for pain, inflammation, and fever.  Be sure to well hydrated with clear liquids and get at  least 8 hours of sleep at night, preferably more while sick.   Please follow up with family medicine in 1 week if needed.     ED Prescriptions    Medication Sig Dispense Auth. Provider   predniSONE (DELTASONE) 50 MG tablet Take 1 tablet (50 mg total) by mouth daily with breakfast for 5 days. 5 tablet Lurene Shadow, PA-C     Controlled Substance Prescriptions [place] Controlled Substance Registry consulted? Not Applicable   Rolla Plate 07/30/18 1116

## 2018-07-30 NOTE — Discharge Instructions (Signed)
  You may take 500mg acetaminophen every 4-6 hours or in combination with ibuprofen 400mg every 6-8 hours as needed for pain, inflammation, and fever.  Be sure to well hydrated with clear liquids and get at least 8 hours of sleep at night, preferably more while sick.   Please follow up with family medicine in 1 week if needed.  

## 2018-07-30 NOTE — ED Triage Notes (Signed)
Pt c/o hoarseness, cough with clear phlegm, and post nasal drip x 2 wks; sore throat x 1 day. She has taken Claritin without relief. Denies fever, SOB, or travel.

## 2018-08-02 ENCOUNTER — Telehealth: Payer: Self-pay | Admitting: Family Medicine

## 2018-08-02 MED ORDER — ALBUTEROL SULFATE HFA 108 (90 BASE) MCG/ACT IN AERS
2.0000 | INHALATION_SPRAY | RESPIRATORY_TRACT | 0 refills | Status: AC | PRN
Start: 2018-08-02 — End: ?

## 2018-08-02 NOTE — Telephone Encounter (Signed)
Patient requests albuterol inhaler for shortness of breath. Rx sent.

## 2018-08-04 ENCOUNTER — Telehealth: Payer: Self-pay

## 2018-08-04 NOTE — Telephone Encounter (Signed)
Another note cannot be re-written at this point unless she is reseen as initial visit did not justify extended leave of abscess. It is recommended she be reevaluated, preferably by her primary care provider in case a referral for ENT is needed.

## 2018-08-04 NOTE — Telephone Encounter (Signed)
Pt called see note

## 2018-08-04 NOTE — Telephone Encounter (Signed)
Spoke to patient and encouraged her to follow up with PCP.

## 2018-08-25 IMAGING — DX DG HIP (WITH OR WITHOUT PELVIS) 2-3V*L*
3 series · 3 of 3 positions shown · non-contrast
Comparison: None.

CLINICAL DATA: Left groin pain, no known injury, initial encounter

EXAM:
DG HIP (WITH OR WITHOUT PELVIS) 2-3V LEFT

[pelvis ap]
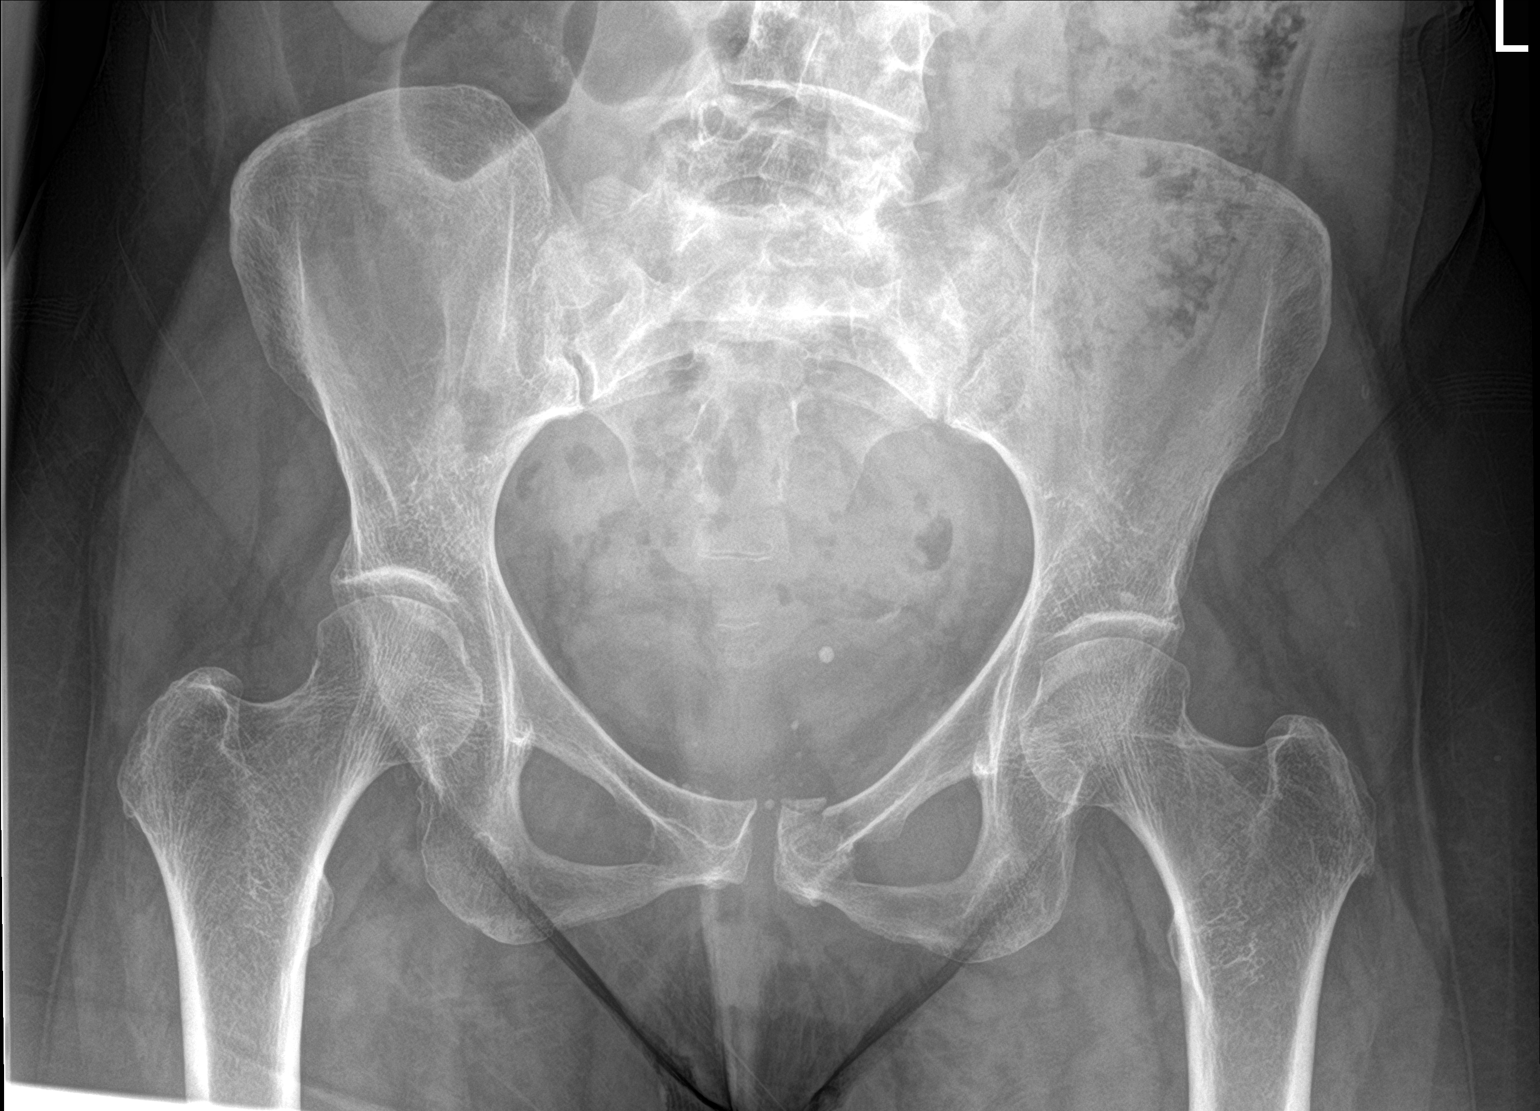

[hip ap]
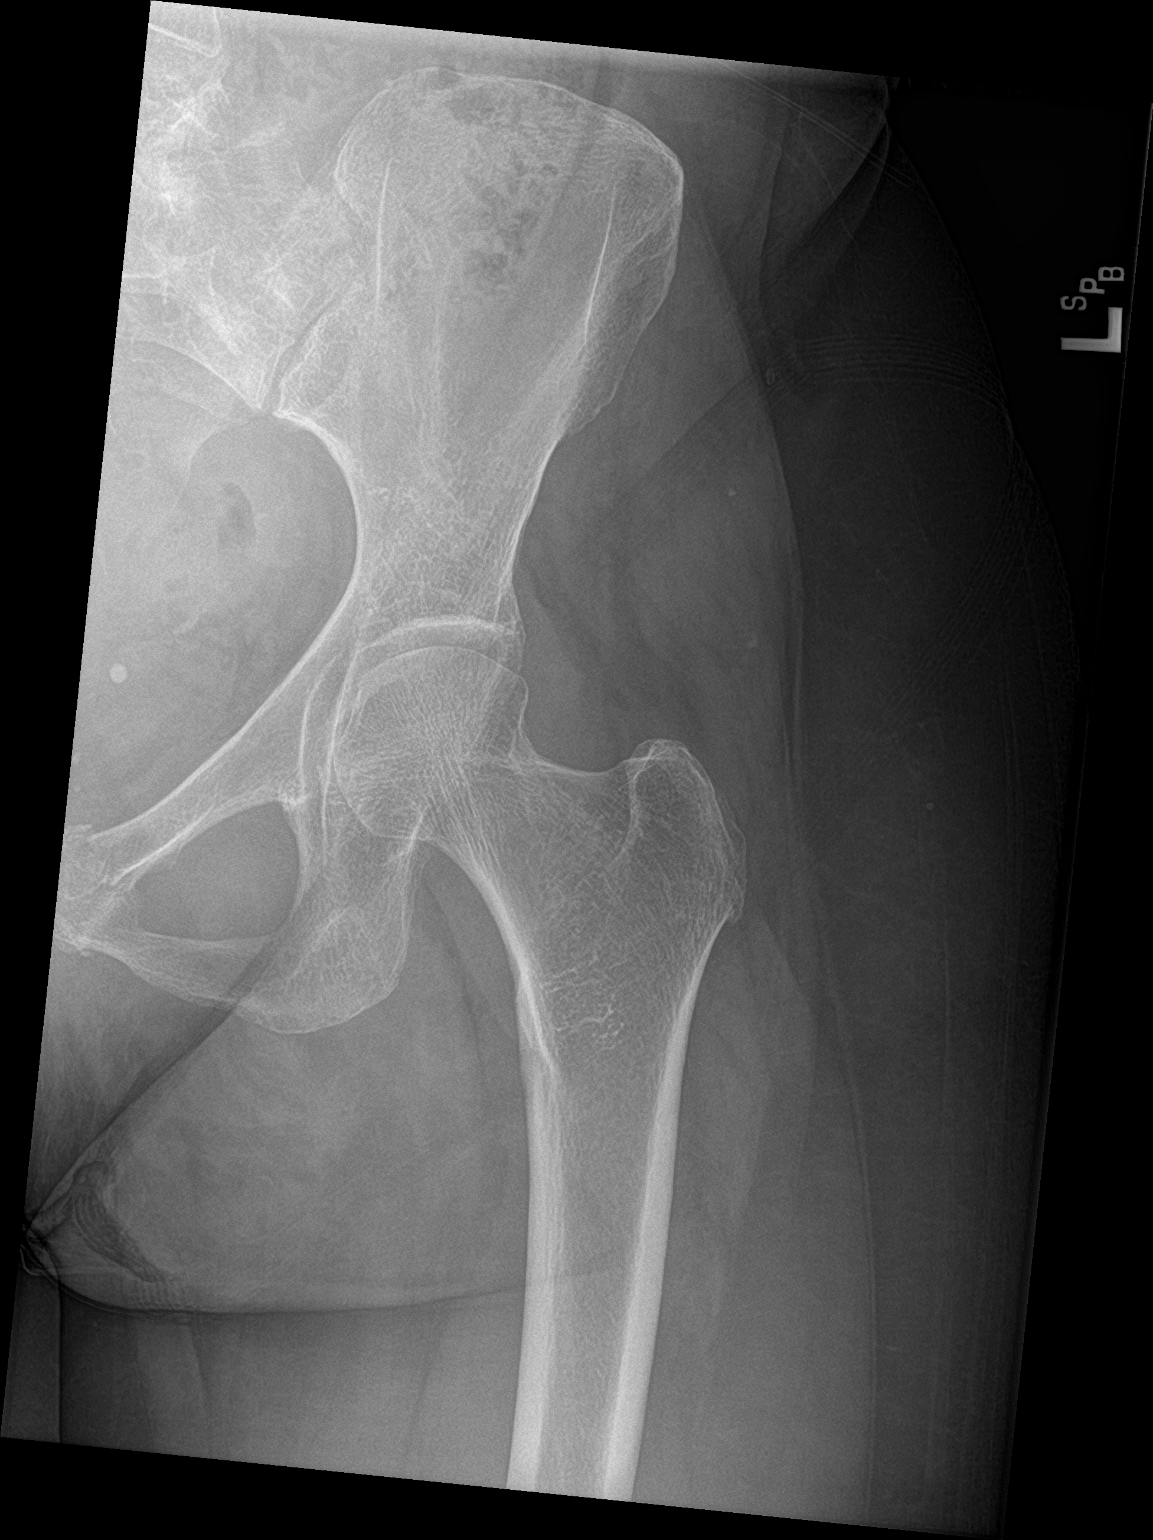

[hip frog leg]
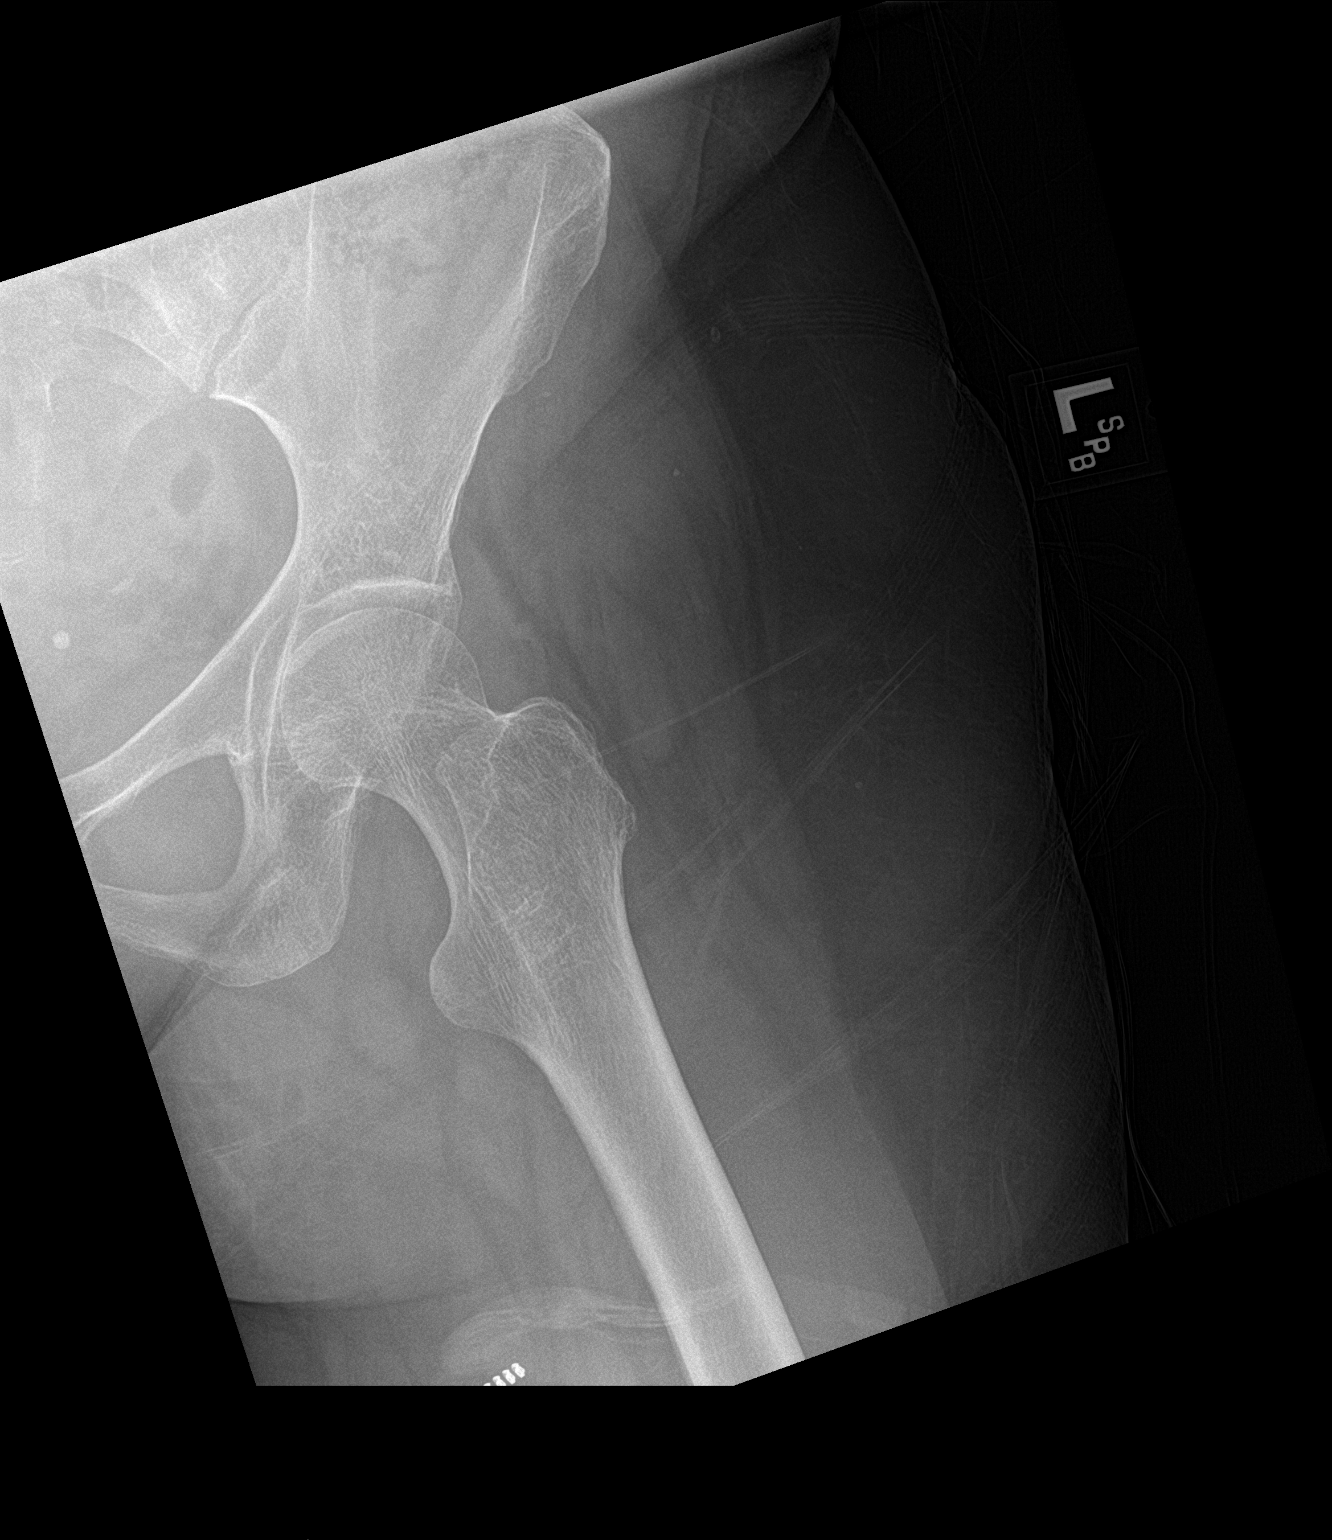

[3 of 3 positions shown; findings below may reference images not displayed]

FINDINGS: Pelvic ring demonstrates evidence of a fracture through the superior
left pubic ramus extending into the pubic symphysis. There is likely
undisplaced inferior pubic ramus fracture centrally as well.
Proximal femur is within normal limits. Mild degenerative changes of
the hip joints are seen.
IMPRESSION: Pubic ramus fractures on the left likely of an acute to subacute
nature. Clinical correlation is recommended.

## 2018-09-15 IMAGING — DX DG PELVIS 1-2V
1 series · 1 of 1 positions shown · non-contrast
Comparison: AP pelvis of June 20, 2016 and June 13, 2016.

CLINICAL DATA: Follow-up of a closed fracture of the left superior
and inferior pubic rami.

EXAM:
PELVIS - 1-2 VIEW

[pelvis ap]
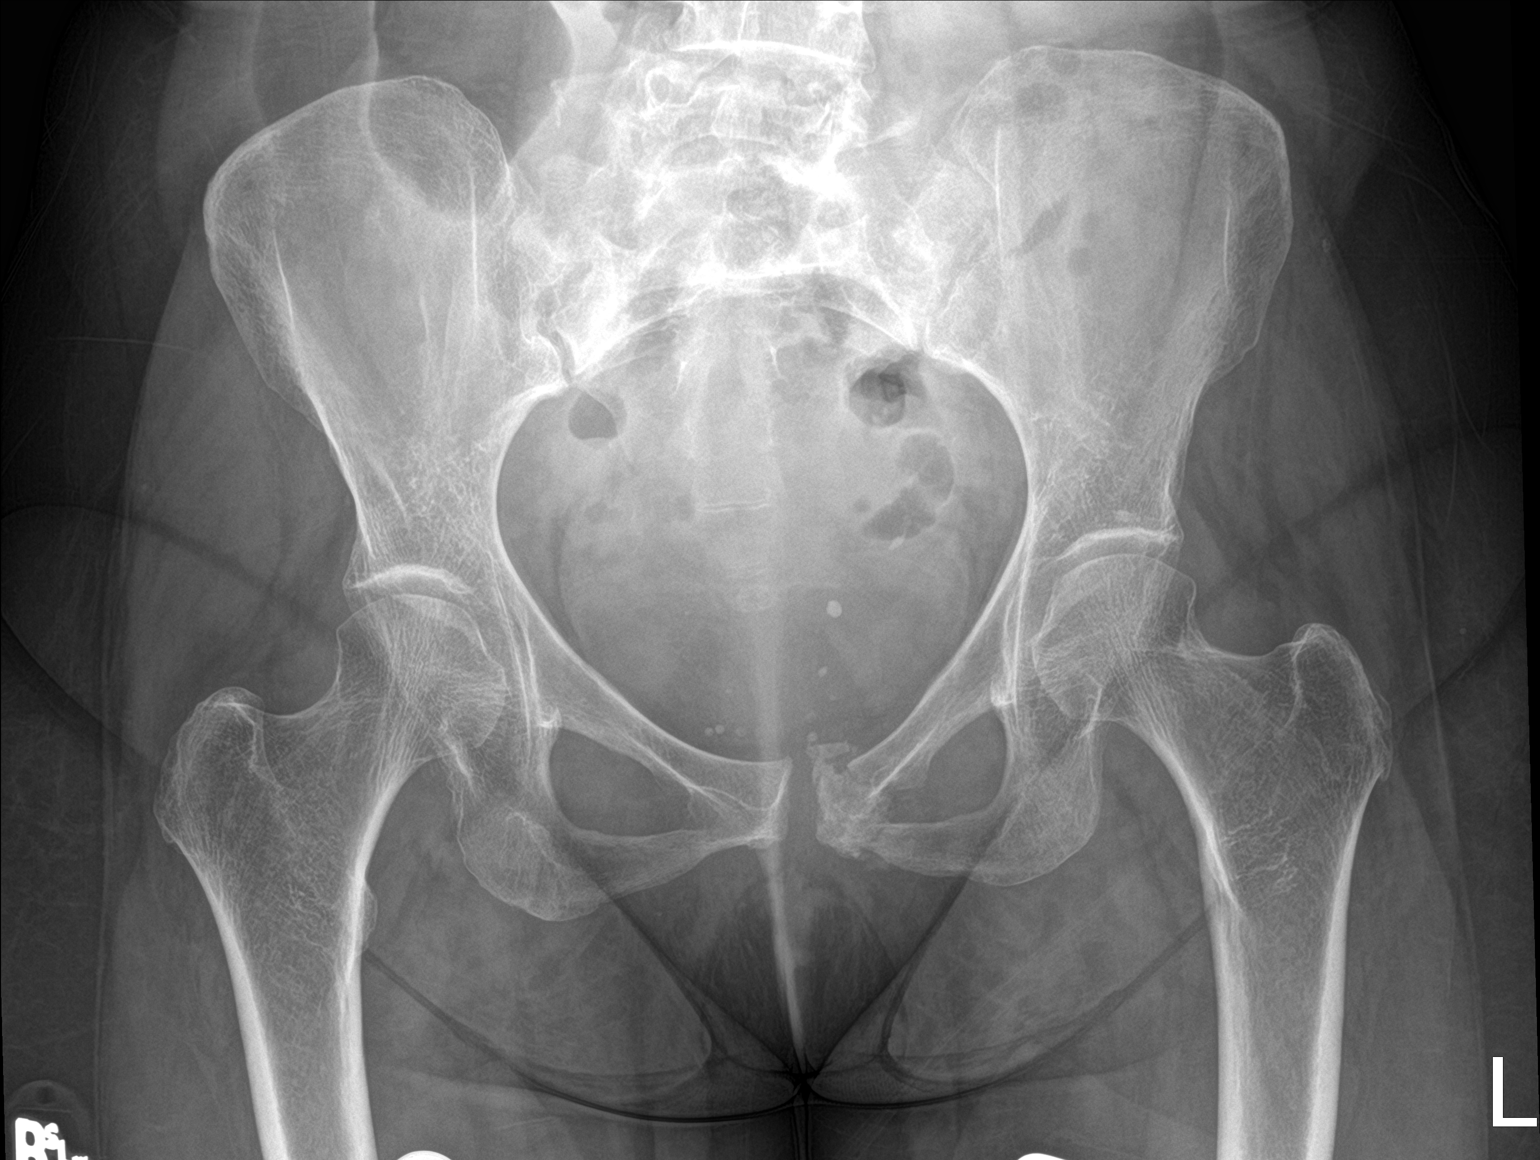

[1 of 1 positions shown; findings below may reference images not displayed]

FINDINGS: The comminuted fractures of the parasymphyseal portions of the
superior and inferior pubic rami are again demonstrated. There is a
small amount of periosteal reaction. A bony fragment along the
superior aspect of the superior pubic ramus is more distracted today
than on the previous study. Elsewhere the bony pelvis exhibits no
acute or healing fracture.
IMPRESSION: There is some ongoing healing of the parasymphyseal fracture of the
left superior and inferior pubic rami. Distraction of the a fracture
fragment superiorly is greater today than on the previous [place].

## 2018-09-17 IMAGING — CT CT PELVIS W/O CM
2 of 3 series · 15 of 46 positions shown, 17 images · non-contrast
Comparison: None.

CLINICAL DATA: Improving but persistent pubic ramus pain post
fracture.

EXAM:
CT PELVIS WITHOUT CONTRAST
TECHNIQUE: Multidetector CT imaging of the pelvis was performed following the
standard protocol without intravenous contrast.

[Series 2: axial bone · axial · 0.94mm/px · z∈[-319,-101]mm · 12 of 127 slices shown, 14 images]
[im 9/127  soft-tissue]
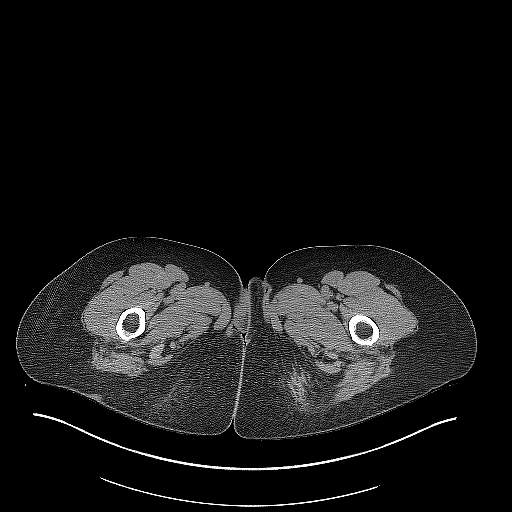
[im 9/127  bone]
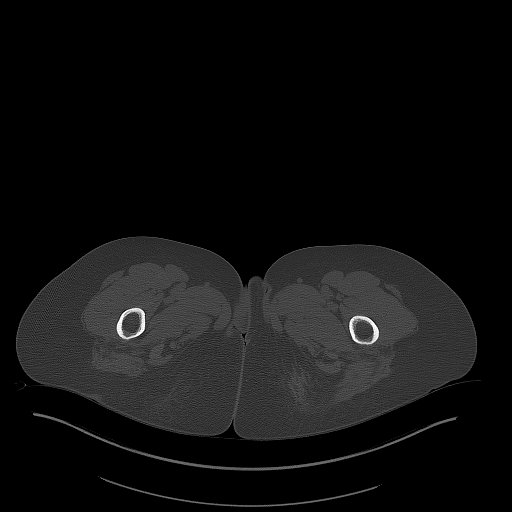
[im 17/127  soft-tissue]
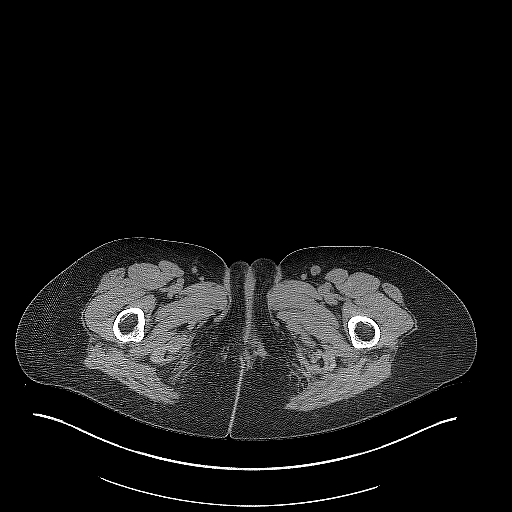
[im 29/127  soft-tissue]
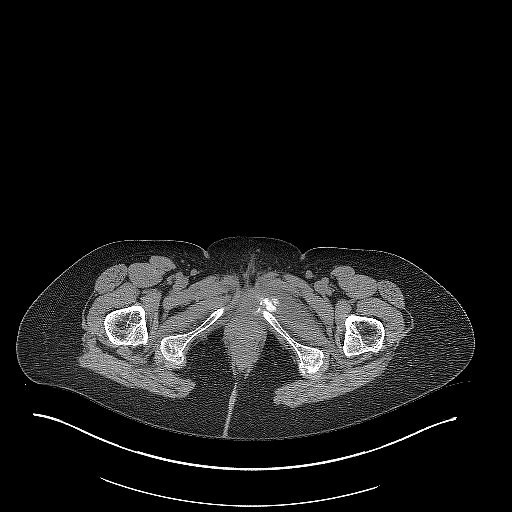
[im 37/127  soft-tissue]
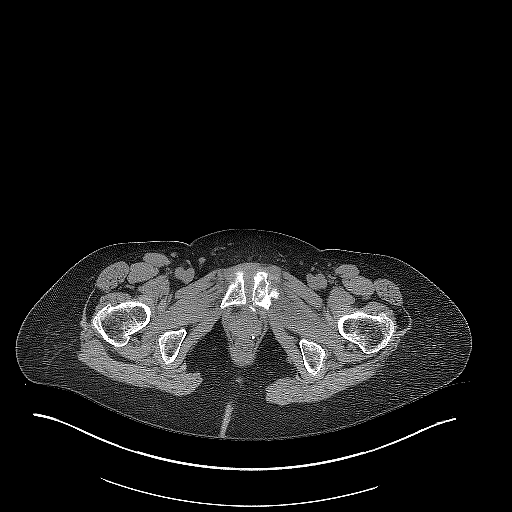
[im 49/127  soft-tissue]
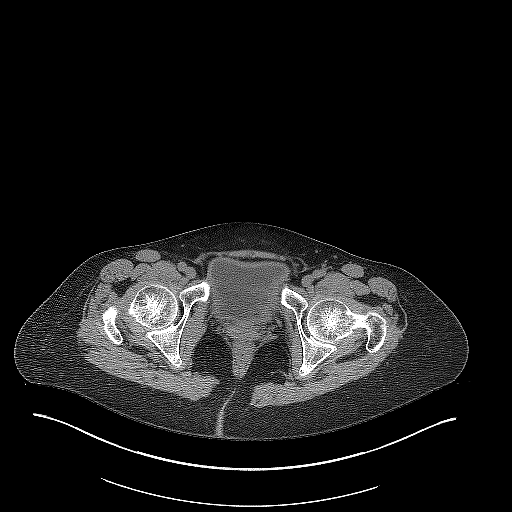
[im 57/127  soft-tissue]
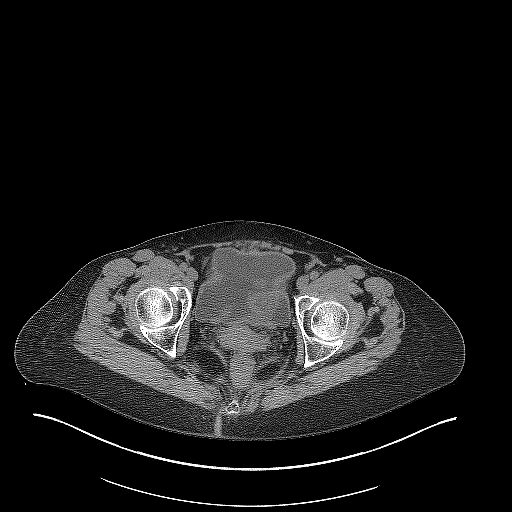
[im 70/127  soft-tissue]
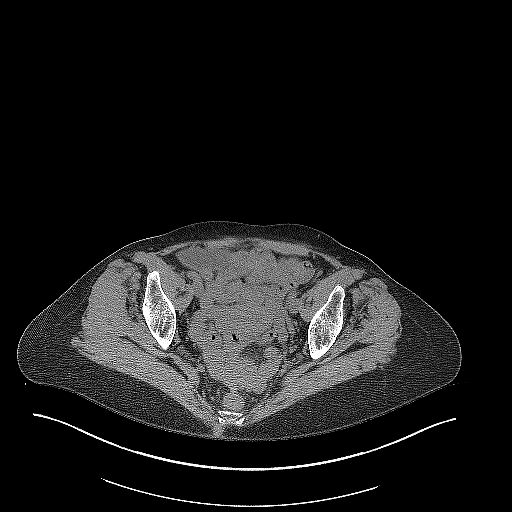
[im 78/127  soft-tissue]
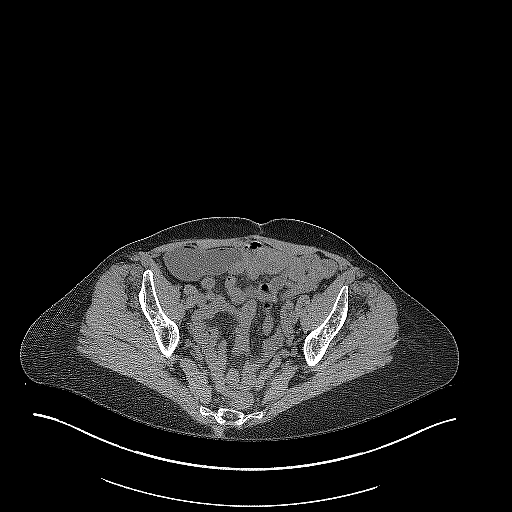
[im 90/127  soft-tissue]
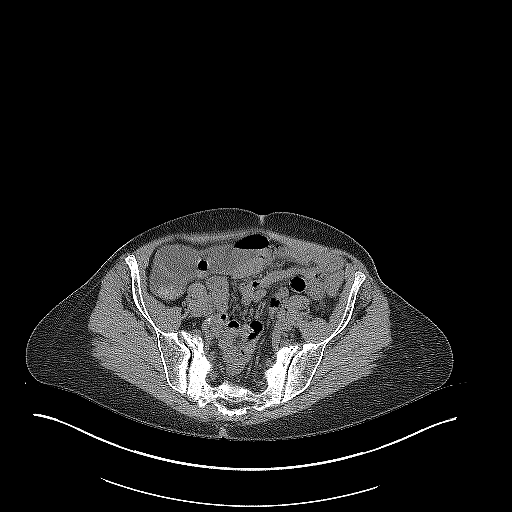
[im 90/127  bone]
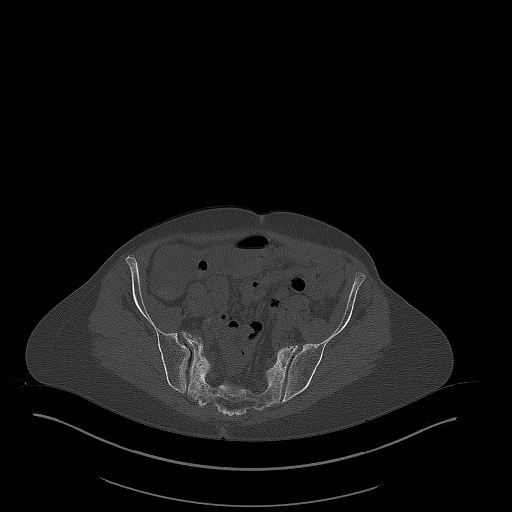
[im 98/127  soft-tissue]
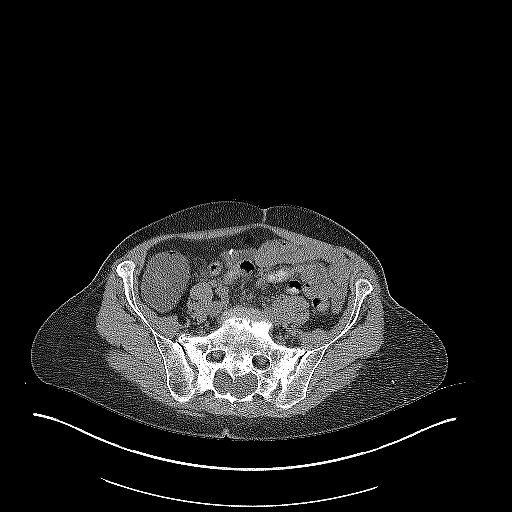
[im 110/127  soft-tissue]
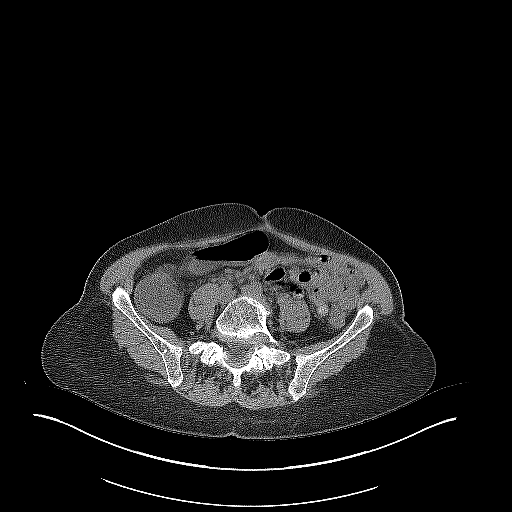
[im 118/127  soft-tissue]
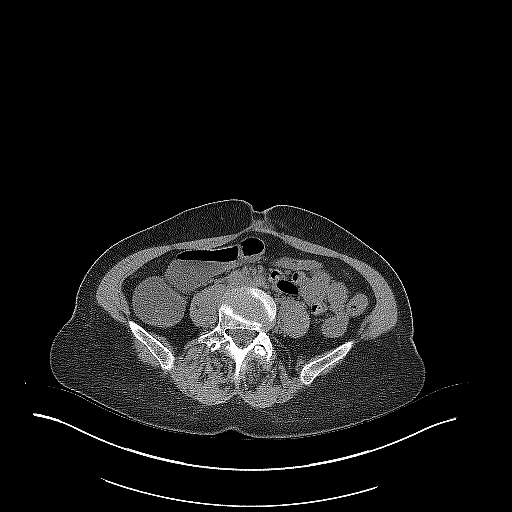

[Series 5: coronal st · coronal · 0.49mm/px · 3 of 106 slices shown]
[im 36/106  soft-tissue]
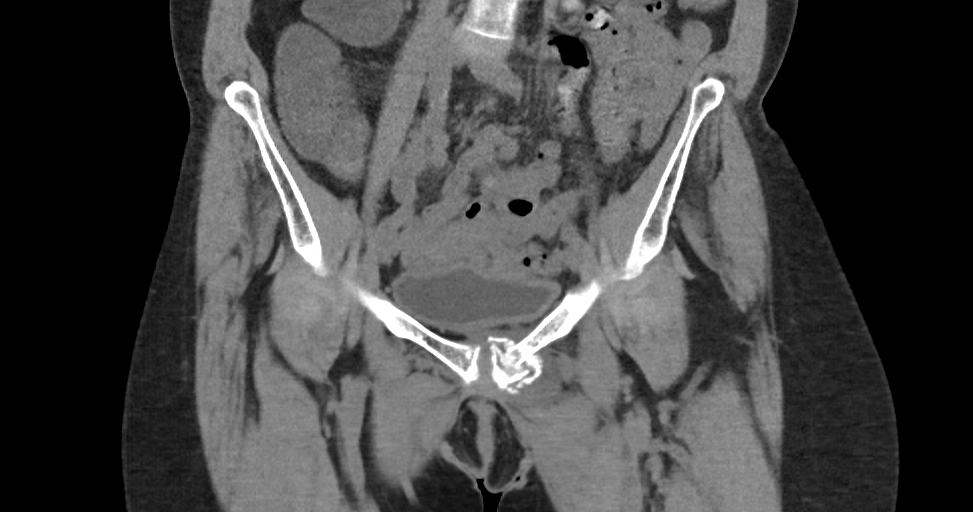
[im 47/106  soft-tissue]
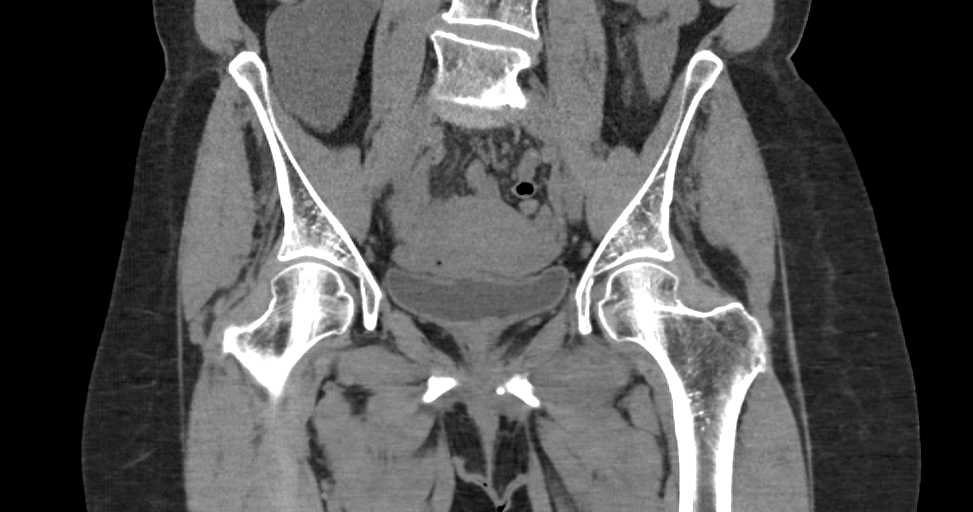
[im 59/106  soft-tissue]
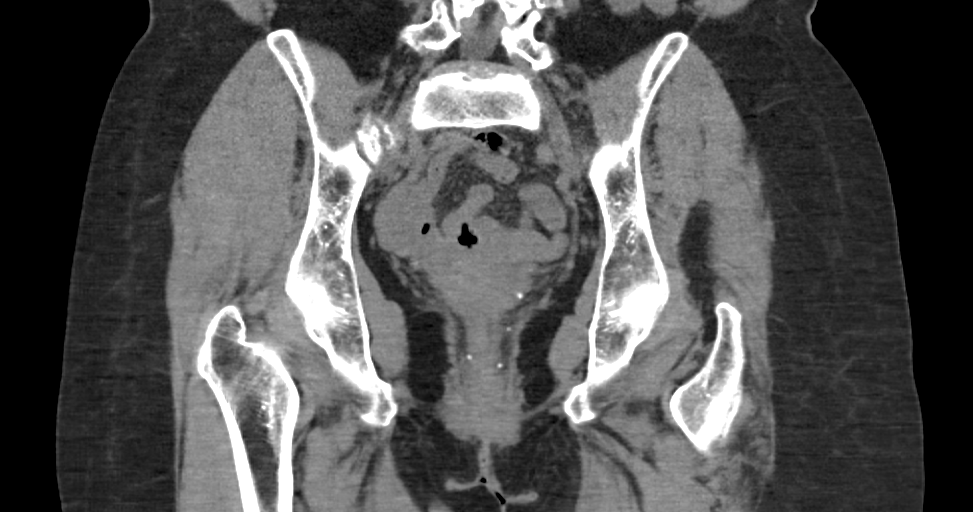

[15 of 46 positions shown; findings below may reference images not displayed]

FINDINGS: Urinary Tract:  No abnormality visualized.

Bowel: Nonobstructing bowel large bowel containing umbilical hernia.

Vascular/Lymphatic: No pathologically enlarged lymph nodes. No
significant vascular abnormality seen.

Reproductive:  No mass or other significant abnormality

Other:  None.

Musculoskeletal: Bilateral sacral insufficiency fractures with
slight coarsened sclerotic appearance of the bony sacrum. Left
superior and inferior pubic rami fractures with comminution. Some
minimal soft tissue callus is noted without significant bony
bridging at this time. Hip joints are maintained.
IMPRESSION: 1. There is some callus formation about known left superior and
inferior pubic rami fractures suggesting healing. Incomplete osseous
union is noted at this time.
2. Bilateral insufficiency fractures the sacral ala. Slightly
coarsened and sclerotic appearance of the sacrum is nonspecific
possibly representing sequela of Paget's or chronic repetitive
sclerosis secondary to insufficiency fractures.
